# Patient Record
Sex: Male | Born: 1980
Health system: Southern US, Community
[De-identification: ages and names within clinical notes are randomized; demographics above are authoritative.]

## PROBLEM LIST (undated history)

## (undated) DIAGNOSIS — Z83438 Family history of other disorder of lipoprotein metabolism and other lipidemia: Secondary | ICD-10-CM

## (undated) DIAGNOSIS — G8929 Other chronic pain: Secondary | ICD-10-CM

## (undated) DIAGNOSIS — F411 Generalized anxiety disorder: Secondary | ICD-10-CM

## (undated) DIAGNOSIS — F430 Acute stress reaction: Secondary | ICD-10-CM

## (undated) DIAGNOSIS — M542 Cervicalgia: Secondary | ICD-10-CM

## (undated) DIAGNOSIS — E559 Vitamin D deficiency, unspecified: Secondary | ICD-10-CM

## (undated) DIAGNOSIS — F410 Panic disorder [episodic paroxysmal anxiety] without agoraphobia: Secondary | ICD-10-CM

## (undated) DIAGNOSIS — Z8249 Family history of ischemic heart disease and other diseases of the circulatory system: Secondary | ICD-10-CM

## (undated) DIAGNOSIS — E663 Overweight: Secondary | ICD-10-CM

## (undated) HISTORY — DX: Cervicalgia: M54.2

## (undated) HISTORY — DX: Acute stress reaction: F43.0

## (undated) HISTORY — DX: Other chronic pain: G89.29

## (undated) HISTORY — DX: Family history of other disorder of lipoprotein metabolism and other lipidemia: Z83.438

## (undated) HISTORY — DX: Family history of ischemic heart disease and other diseases of the circulatory system: Z82.49

## (undated) HISTORY — DX: Vitamin D deficiency, unspecified: E55.9

## (undated) HISTORY — PX: TONSILLECTOMY: SUR1361

## (undated) HISTORY — DX: Generalized anxiety disorder: F41.1

## (undated) HISTORY — DX: Panic disorder (episodic paroxysmal anxiety): F41.0

## (undated) HISTORY — DX: Overweight: E66.3

---

## 2013-09-05 DIAGNOSIS — G8929 Other chronic pain: Secondary | ICD-10-CM | POA: Insufficient documentation

## 2013-09-05 HISTORY — DX: Other chronic pain: G89.29

## 2016-05-29 ENCOUNTER — Encounter: Payer: Self-pay | Admitting: Family Medicine

## 2016-05-29 ENCOUNTER — Ambulatory Visit (INDEPENDENT_AMBULATORY_CARE_PROVIDER_SITE_OTHER): Payer: Self-pay | Admitting: Family Medicine

## 2016-05-29 VITALS — BP 110/72 | HR 75 | Ht 74.0 in | Wt 215.0 lb

## 2016-05-29 DIAGNOSIS — E663 Overweight: Secondary | ICD-10-CM

## 2016-05-29 DIAGNOSIS — F430 Acute stress reaction: Secondary | ICD-10-CM

## 2016-05-29 DIAGNOSIS — F411 Generalized anxiety disorder: Secondary | ICD-10-CM

## 2016-05-29 DIAGNOSIS — F410 Panic disorder [episodic paroxysmal anxiety] without agoraphobia: Secondary | ICD-10-CM

## 2016-05-29 DIAGNOSIS — E559 Vitamin D deficiency, unspecified: Secondary | ICD-10-CM

## 2016-05-29 DIAGNOSIS — M542 Cervicalgia: Secondary | ICD-10-CM

## 2016-05-29 DIAGNOSIS — G8929 Other chronic pain: Secondary | ICD-10-CM

## 2016-05-29 MED ORDER — VITAMIN D (ERGOCALCIFEROL) 1.25 MG (50000 UNIT) PO CAPS: 50000.0000 [IU] | ORAL_CAPSULE | ORAL | Status: DC

## 2016-05-29 MED ORDER — FLUOXETINE HCL 20 MG PO TABS: ORAL_TABLET | ORAL | Status: DC

## 2016-05-29 MED ORDER — ALPRAZOLAM 0.5 MG PO TABS: ORAL_TABLET | ORAL | Status: DC

## 2016-05-29 MED ORDER — ALPRAZOLAM 0.5 MG PO TBDP: ORAL_TABLET | ORAL | Status: DC

## 2016-05-29 NOTE — Progress Notes (Signed)
Goes by Samuel Vasquez   Samuel Vasquez, D.O. [facility]  Subjective:    CC: New pt, here to establish care.   HPI: Samuel Vasquez is a pleasant 35 y.o. male who [facility] today to become est and tx his anxiety.   Patient was seen by [facility] in the [facility]    He was taking lexapro in past but went off when lost to f/up - been off it for about 1-1.3461mo now.   Worked well for pt but he gained 20 lbs on it.  Tried other SSRI's- paxil- pt didn't like it made him feel like a zombie.  Patient's Mom hated wellbutrin and pt would like to try something else.   4-5 yrs ago- Dr Samuel Vasquez, used clonazapam  I had pt on neurontin in the past for his chronic neck pain - but didn't tol it, so LYRIC given- tol well and worked well but too expensive  Trying to eat better;  joined [employer] couple mo ago and goes 2-3 d/wk- runs treadmill,  lifts weights.   Vitamin D deficiency: Patient stopped his supplements and doesn't know what to take.   PMHX: Vit D defeciency  anxiety Chronic neck pain and arthriitis    Past Medical History  Diagnosis Date  . Chronic neck pain     Past Surgical History  Procedure Laterality Date  . Tonsillectomy      Family History  Problem Relation Age of Onset  . Hyperlipidemia Mother   . Hypertension Mother   . Hypertension Brother     History  Drug Use No  ,  History  Alcohol Use  . Yes    Comment: socially  ,  History  Smoking status  . Former Smoker  . Quit date: 12/21/2010  Smokeless tobacco  . Never Used  ,  History  Sexual Activity  . Sexual Activity: Yes  . Birth Control/ Protection: None    Patient's Medications   No medications on file    ALLERGIES: Review of patient's allergies indicates no known allergies.   Review of Systems: Full 14 point ROS performed via "adult medical history form".  Negative except for noted above    Objective:   Blood  pressure 110/72, pulse 75, height 6\' 2"  (1.88 m), weight 215 lb (97.523 kg). Body mass index is 27.59 kg/(m^2).  General: Well Developed, well nourished, and in no acute distress.  Neuro: Alert and oriented x3, extra-ocular muscles intact, sensation grossly intact.  HEENT: Normocephalic, atraumatic, pupils equal round reactive to light, neck supple, no gross masses, no carotid bruits, no JVD apprec Skin: no gross suspicious lesions or rashes  Cardiac: Regular rate and rhythm, no murmurs rubs or gallops.  Respiratory: Essentially clear to auscultation bilaterally. Not using accessory muscles, speaking in full sentences.  Abdominal: Soft, not grossly distended Musculoskeletal: Ambulates w/o diff, FROM * 4 ext.  Vasc: less 2 sec cap RF, warm and pink  Psych:  No HI/SI, judgement and insight good.    Impression and Recommendations:    The patient was counselled, risk factors were discussed, anticipatory guidance given.  Overweight (BMI 25.0-29.9) Diet and exercise counseling; recommend weight loss of 5-10%.  Vitamin D insufficiency Patient prefers to take 1 pill a week rather than a daily over-the-counter supplement. Prescription for ergocalciferol given. Patient understands he needs to take this daily since he has an indoor job 6 days  per week.  Panic attack as reaction to stress Risk benefits of Xanax discussed with patient.   Abuse potential and need for elevation of dose reviewed with patient. Only use as needed for panic attack   GAD (generalized anxiety disorder) Start low dose and will increase slowly.  Chronic cervical pain Told patient we will get his anxiolytics and medication regimen under control prior to adding on Neurontin which may cause sedation and possible side effects as well. We will address this in the near future at follow-up visits. Stretching, and physical activity will improve chronic condition.    Gross side effects, risk and benefits, and alternatives of  medications discussed with patient.  Patient is aware that all medications have potential side effects and we are unable to predict every sideeffect or drug-drug interaction that may occur.  Expresses verbal understanding and consents to current therapy plan and treatment regiment.  Note: This document was prepared using Dragon voice recognition software and may include unintentional dictation errors.

## 2016-05-29 NOTE — Patient Instructions (Addendum)
- Start fluoxetine- titrate up slowly - risks of xanax discussed with pt - f/up OV in 3-4 wks- come fasting so we can get yrly bloodwork   Adjustment Disorder  Adjustment disorder is an unusually severe reaction to a stressful life event, such as the loss of a job or physical illness. The event may be any stressful event other than the loss of a loved one. Adjustment disorder may affect your feelings, your thinking, how you act, or a combination of these. It may interfere with personal relationships or with the way you are at work, school, or home. People with this disorder are at risk for suicide and substance abuse. They may develop a more serious mental disorder, such as major depressive disorder or post-traumatic stress disorder. SIGNS AND SYMPTOMS  Symptoms may include:  Sadness, depressed mood, or crying spells.  Loss of enjoyment.  Change in appetite or weight.  Sense of loss or hopelessness.  Thoughts of suicide.  Anxiety, worry, or nervousness.  Trouble sleeping.  Avoiding family and friends.  Poor school performance.  Fighting or vandalism.  Reckless driving.  Skipping school.  Poor work International aid/development worker.  Ignoring bills. Symptoms of adjustment disorder start within 3 months of the stressful life event. They do not last more than 6 months after the event has ended. DIAGNOSIS  To make a diagnosis, your health care provider will ask about what has happened in your life and how it has affected you. He or she may also ask about your medical history and use of medicines, alcohol, and other substances. Your health care provider may do a physical exam and order lab tests or other studies. You may be referred to a mental health specialist for evaluation. TREATMENT  Treatment options include:  Counseling or talk therapy. Talk therapy is usually provided by mental health specialists.  Medicine. Certain medicines may help with depression, anxiety, and sleep.  Support groups.  Support groups offer emotional support, advice, and guidance. They are made up of people who have had similar experiences. HOME CARE INSTRUCTIONS  Keep all follow-up visits as directed by your health care provider. This is important.  Take medicines only as directed by your health care provider. SEEK MEDICAL CARE IF:  Your symptoms get worse.  SEEK IMMEDIATE MEDICAL CARE IF: You have serious thoughts about hurting yourself or someone else. MAKE SURE YOU:  Understand these instructions.  Will watch your condition.  Will get help right away if you are not doing well or get worse.   This information is not intended to replace advice given to you by your health care provider. Make sure you discuss any questions you have with your health care provider.   Document Released: 08/11/2006 Document Revised: 12/28/2014 Document Reviewed: 05/01/2014 Elsevier Interactive Patient Education 2016 Elsevier Inc.   Generalized Anxiety Disorder Generalized anxiety disorder (GAD) is a mental disorder. It interferes with life functions, including relationships, work, and school. GAD is different from normal anxiety, which everyone experiences at some point in their lives in response to specific life events and activities. Normal anxiety actually helps [place] prepare for and get through these life events and activities. Normal anxiety goes away after the event or activity is over.  GAD causes anxiety that is not necessarily related to specific events or activities. It also causes excess anxiety in proportion to specific events or activities. The anxiety associated with GAD is also difficult to control. GAD can vary from mild to severe. People with severe GAD can have intense  waves of anxiety with physical symptoms (panic attacks).  SYMPTOMS The anxiety and worry associated with GAD are difficult to control. This anxiety and worry are related to many life events and activities and also occur more days than not for  6 months or longer. People with GAD also have three or more of the following symptoms (one or more in children):  Restlessness.   Fatigue.  Difficulty concentrating.   Irritability.  Muscle tension.  Difficulty sleeping or unsatisfying sleep. DIAGNOSIS GAD is diagnosed through an assessment by your health care provider. Your health care provider will ask you questions aboutyour mood,physical symptoms, and events in your life. Your health care provider may ask you about your medical history and use of alcohol or drugs, including prescription medicines. Your health care provider may also do a physical exam and blood tests. Certain medical conditions and the use of certain substances can cause symptoms similar to those associated with GAD. Your health care provider may refer you to a mental health specialist for further evaluation. TREATMENT The following therapies are usually used to treat GAD:   Medication. Antidepressant medication usually is prescribed for long-term daily control. Antianxiety medicines may be added in severe cases, especially when panic attacks occur.   Talk therapy (psychotherapy). Certain types of talk therapy can be helpful in treating GAD by providing support, education, and guidance. A form of talk therapy called cognitive behavioral therapy can teach you healthy ways to think about and react to daily life events and activities.  Stress managementtechniques. These include yoga, meditation, and exercise and can be very helpful when they are practiced regularly. A mental health specialist can help determine which treatment is best for you. Some people see improvement with one therapy. However, other people require a combination of therapies.   This information is not intended to replace advice given to you by your health care provider. Make sure you discuss any questions you have with your health care provider.   Document Released: 04/03/2013 Document Revised:  12/28/2014 Document Reviewed: 04/03/2013 Elsevier Interactive Patient Education [employer]2016 Elsevier Inc.

## 2016-06-01 DIAGNOSIS — F410 Panic disorder [episodic paroxysmal anxiety] without agoraphobia: Secondary | ICD-10-CM | POA: Insufficient documentation

## 2016-06-01 DIAGNOSIS — E663 Overweight: Secondary | ICD-10-CM

## 2016-06-01 DIAGNOSIS — E559 Vitamin D deficiency, unspecified: Secondary | ICD-10-CM

## 2016-06-01 DIAGNOSIS — F430 Acute stress reaction: Secondary | ICD-10-CM

## 2016-06-01 DIAGNOSIS — F411 Generalized anxiety disorder: Secondary | ICD-10-CM | POA: Insufficient documentation

## 2016-06-01 HISTORY — DX: Vitamin D deficiency, unspecified: E55.9

## 2016-06-01 HISTORY — DX: Generalized anxiety disorder: F41.1

## 2016-06-01 HISTORY — DX: Panic disorder (episodic paroxysmal anxiety): F43.0

## 2016-06-01 HISTORY — DX: Overweight: E66.3

## 2016-06-01 HISTORY — DX: Panic disorder (episodic paroxysmal anxiety): F41.0

## 2016-06-01 NOTE — Assessment & Plan Note (Signed)
Risk benefits of Xanax discussed with patient.   Abuse potential and need for elevation of dose reviewed with patient. Only use as needed for panic attack

## 2016-06-01 NOTE — Assessment & Plan Note (Signed)
Diet and exercise counseling; recommend weight loss of 5-10%.

## 2016-06-01 NOTE — Assessment & Plan Note (Signed)
Start low dose and will increase slowly.

## 2016-06-01 NOTE — Assessment & Plan Note (Signed)
Patient prefers to take 1 pill a week rather than a daily over-the-counter supplement. Prescription for ergocalciferol given. Patient understands he needs to take this daily since he has an indoor job 6 days per week.

## 2016-06-01 NOTE — Assessment & Plan Note (Signed)
Told patient we will get his anxiolytics and medication regimen under control prior to adding on Neurontin which may cause sedation and possible side effects as well. We will address this in the near future at follow-up visits. Stretching, and physical activity will improve chronic condition.

## 2016-06-26 ENCOUNTER — Ambulatory Visit (INDEPENDENT_AMBULATORY_CARE_PROVIDER_SITE_OTHER): Payer: BLUE CROSS/BLUE SHIELD | Admitting: Family Medicine

## 2016-06-26 ENCOUNTER — Encounter: Payer: Self-pay | Admitting: Family Medicine

## 2016-06-26 VITALS — BP 124/80 | HR 67 | Ht 74.0 in | Wt 211.3 lb

## 2016-06-26 DIAGNOSIS — E663 Overweight: Secondary | ICD-10-CM

## 2016-06-26 DIAGNOSIS — Z113 Encounter for screening for infections with a predominantly sexual mode of transmission: Secondary | ICD-10-CM | POA: Diagnosis not present

## 2016-06-26 DIAGNOSIS — Z832 Family history of diseases of the blood and blood-forming organs and certain disorders involving the immune mechanism: Secondary | ICD-10-CM

## 2016-06-26 DIAGNOSIS — F410 Panic disorder [episodic paroxysmal anxiety] without agoraphobia: Secondary | ICD-10-CM

## 2016-06-26 DIAGNOSIS — E559 Vitamin D deficiency, unspecified: Secondary | ICD-10-CM

## 2016-06-26 DIAGNOSIS — F430 Acute stress reaction: Secondary | ICD-10-CM

## 2016-06-26 DIAGNOSIS — Z8249 Family history of ischemic heart disease and other diseases of the circulatory system: Secondary | ICD-10-CM

## 2016-06-26 DIAGNOSIS — Z0000 Encounter for general adult medical examination without abnormal findings: Secondary | ICD-10-CM

## 2016-06-26 DIAGNOSIS — Z708 Other sex counseling: Secondary | ICD-10-CM

## 2016-06-26 DIAGNOSIS — Z114 Encounter for screening for human immunodeficiency virus [HIV]: Secondary | ICD-10-CM | POA: Diagnosis not present

## 2016-06-26 DIAGNOSIS — Z83438 Family history of other disorder of lipoprotein metabolism and other lipidemia: Secondary | ICD-10-CM

## 2016-06-26 DIAGNOSIS — F411 Generalized anxiety disorder: Secondary | ICD-10-CM | POA: Diagnosis not present

## 2016-06-26 HISTORY — DX: Family history of other disorder of lipoprotein metabolism and other lipidemia: Z83.438

## 2016-06-26 HISTORY — DX: Family history of ischemic heart disease and other diseases of the circulatory system: Z82.49

## 2016-06-26 NOTE — Progress Notes (Signed)
Subjective:    Chief Complaint  Patient presents with  . Anxiety    follow up    HPI: Samuel Vasquez Samuel Vasquez("Samuel Vasquez ")  is a 35 y.o. male who [facility] today for follow-up of his anxiety.   We started at 10 mg of Prozac last office visit. At that time his GAD-7 was a score of 10 and repeat of this today was a score of 3.  Patient states that his anxiety is significantly improved and he is over 60% improved today.  He has only had to use a couple tablets of the Xanax within the past 2 weeks.    He also states he feels more calm and his mind constantly going and worrying about things has almost altogether stopped.  He has not increased his visits to the gym and still only going 2-3 times per week.  Patient is sexually active with a new girlfriend for the past couple months.   He states he does not always use condoms. Has not had an STD screen in 2+ years.      Patient is fasting today and is here to obtain yearly lab work as well as would like STD screening after our discussion.     Past Medical History  Diagnosis Date  . Chronic neck pain   . GAD (generalized anxiety disorder) 06/01/2016  . Panic attack as reaction to stress 06/01/2016  . Chronic cervical pain 09/05/2013  . Vitamin D insufficiency 06/01/2016  . Overweight (BMI 25.0-29.9) 06/01/2016     Past Surgical History  Procedure Laterality Date  . Tonsillectomy       Family History  Problem Relation Age of Onset  . Hyperlipidemia Mother   . Hypertension Mother   . Hypertension Brother      History  Drug Use No  ,  History  Alcohol Use  . Yes    Comment: socially  ,  History  Smoking status  . Former Smoker  . Quit date: 12/21/2010  Smokeless tobacco  . Never Used  ,  History  Sexual Activity  . Sexual Activity: Yes  . Birth Control/ Protection: None      Current Outpatient Prescriptions on File Prior to Visit  Medication Sig Dispense Refill  . ALPRAZolam  (XANAX) 0.5 MG tablet take one tab po prn panic attack 30 tablet 0  . FLUoxetine (PROZAC) 20 MG tablet Take 1/2 tab for one week, then 1 tab a day 90 tablet 1  . Vitamin D, Ergocalciferol, (DRISDOL) 50000 units CAPS capsule Take 1 capsule (50,000 Units total) by mouth every 7 (seven) days. Take for 8 total doses(weeks) 12 capsule 6   No current facility-administered medications on file prior to visit.    No Known Allergies    Review of Systems:  ( Completed via adult medical history intake form today ) General:  Denies fever, chills, appetite changes, unexplained weight loss.  Respiratory: Denies SOB, DOE, cough, wheezing.  Cardiovascular: Denies chest pain, palpitations.  Gastrointestinal: Denies nausea, vomiting, diarrhea, abdominal pain.  Genitourinary: Denies dysuria, increased frequency, flank pain. Endocrine: Denies hot or cold intolerance, polyuria, polydipsia. Musculoskeletal: Denies myalgias, back pain, joint swelling, arthralgias, gait problems.  Skin: Denies pallor, rash, suspicious lesions.  Neurological: Denies dizziness, seizures, syncope, unexplained weakness, lightheadedness, numbness and headaches.  Psychiatric/Behavioral: Denies mood changes, suicidal or homicidal ideations, hallucinations, sleep disturbances.    Objective:    Blood pressure 124/80, pulse 67, height  6\' 2"  (1.88 m), weight 211 lb 4.8 oz (95.845 kg). Body mass index is 27.12 kg/(m^2). General: Well Developed, well nourished, and in no acute distress.  HEENT: Normocephalic, atraumatic, pupils equal round reactive to light, neck supple Skin: Warm and dry, cap RF less 2 sec Cardiac: Regular rate and rhythm, S1, S2 WNL's, no murmurs rubs or gallops Respiratory: ECTA B/L, Not using accessory muscles, speaking in full sentences. NeuroM-Sk: Ambulates w/o assistance, moves ext * 4 w/o difficulty, sensation grossly intact.  Psych: A and O *3, judgement and insight good.     Impression and  Recommendations:    The patient was counselled, risk factors were discussed, anticipatory guidance given.   1) GAD/panic attacks:  continue Prozac at 20 mg per day.  Counseling done regarding Xanax and told to use sparingly. Reviewed square breathing techniques and stress management techniques with patient and asked him to do 10 square breathings 3 times per day.  Encouraged increase in moderate intensity aerobic activity 150 minutes per week. 2)  unprotected sex:  safe sex practices discussed with patient and STD counseling done. Recommend both he and his partner get a full battery of tests 3) fasting labs done today. ---- Return to clinic 6 months for complete physical exam or sooner if problems\issues or concerns. Since patient is well known to me from my prior practices, we will just give him a call regarding the results of his recent screening tests.  Gross side effects, risk and benefits, and alternatives of medications discussed with patient.  Patient is aware that all medications have potential side effects and we are unable to predict every side effect or drug-drug interaction that may occur.  Expresses verbal understanding and consents to current therapy plan and treatment regimen.  Note: This document was prepared using Dragon voice recognition software and may include unintentional dictation errors.

## 2016-06-27 LAB — CBC WITH DIFFERENTIAL/PLATELET
BASOS ABS: 66 {cells}/uL (ref 0–200)
Basophils Relative: 2 %
EOS ABS: 33 {cells}/uL (ref 15–500)
Eosinophils Relative: 1 %
HEMATOCRIT: 46.4 % (ref 38.5–50.0)
Hemoglobin: 16.2 g/dL (ref 13.2–17.1)
LYMPHS PCT: 31 %
Lymphs Abs: 1023 cells/uL (ref 850–3900)
MCH: 31.8 pg (ref 27.0–33.0)
MCHC: 34.9 g/dL (ref 32.0–36.0)
MCV: 91 fL (ref 80.0–100.0)
MONO ABS: 330 {cells}/uL (ref 200–950)
MONOS PCT: 10 %
MPV: 11.3 fL (ref 7.5–12.5)
NEUTROS ABS: 1848 {cells}/uL (ref 1500–7800)
Neutrophils Relative %: 56 %
PLATELETS: 259 10*3/uL (ref 140–400)
RBC: 5.1 MIL/uL (ref 4.20–5.80)
RDW: 12.9 % (ref 11.0–15.0)
WBC: 3.3 10*3/uL — ABNORMAL LOW (ref 3.8–10.8)

## 2016-06-27 LAB — COMPREHENSIVE METABOLIC PANEL
ALBUMIN: 4.5 g/dL (ref 3.6–5.1)
ALK PHOS: 44 U/L (ref 40–115)
ALT: 15 U/L (ref 9–46)
AST: 19 U/L (ref 10–40)
BILIRUBIN TOTAL: 0.8 mg/dL (ref 0.2–1.2)
BUN: 16 mg/dL (ref 7–25)
CALCIUM: 9.3 mg/dL (ref 8.6–10.3)
CHLORIDE: 100 mmol/L (ref 98–110)
CO2: 26 mmol/L (ref 20–31)
CREATININE: 0.94 mg/dL (ref 0.60–1.35)
Glucose, Bld: 85 mg/dL (ref 65–99)
POTASSIUM: 4.3 mmol/L (ref 3.5–5.3)
Sodium: 139 mmol/L (ref 135–146)
TOTAL PROTEIN: 7.2 g/dL (ref 6.1–8.1)

## 2016-06-27 LAB — LIPID PANEL
CHOL/HDL RATIO: 2.5 ratio (ref <=5.0)
Cholesterol: 181 mg/dL (ref 125–200)
HDL: 73 mg/dL (ref >=40)
LDL Cholesterol: 99 mg/dL (ref <130)
Triglycerides: 45 mg/dL (ref <150)
VLDL: 9 mg/dL (ref <30)

## 2016-06-27 LAB — HIV ANTIBODY (ROUTINE TESTING W REFLEX): HIV: NONREACTIVE

## 2016-06-27 LAB — HEMOGLOBIN A1C
Hgb A1c MFr Bld: 5 % (ref <5.7)
Mean Plasma Glucose: 97 mg/dL

## 2016-06-27 LAB — HEPATITIS B SURFACE ANTIGEN: Hepatitis B Surface Ag: NEGATIVE

## 2016-06-27 LAB — RPR

## 2016-06-27 LAB — VITAMIN D 25 HYDROXY (VIT D DEFICIENCY, FRACTURES): VIT D 25 HYDROXY: 51 ng/mL (ref 30–100)

## 2016-06-27 LAB — TSH: TSH: 1.24 mIU/L (ref 0.40–4.50)

## 2016-06-27 LAB — HEPATITIS C ANTIBODY: HCV AB: NEGATIVE

## 2016-06-29 LAB — HSV(HERPES SMPLX)ABS-I+II(IGG+IGM)-BLD
HSV 1 GLYCOPROTEIN G AB, IGG: 46.9 {index} — AB (ref <0.90)
HSV 2 Glycoprotein G Ab, IgG: <0.9 Index (ref <0.90)
Herpes Simplex Vrs I&II-IgM Ab (EIA): 0.45 INDEX

## 2016-07-01 ENCOUNTER — Telehealth: Payer: Self-pay

## 2016-07-01 NOTE — Telephone Encounter (Signed)
Samuel Vasquez please let him know that not all his labs have resulted and I was waiting until they are all completed before I commented.  Yesterday I was still waiting on 3 or 4 of them today at 6:10 PM I'm still waiting on 1. Please call him and tell him this and hopefully we will get the results soon.

## 2016-07-01 NOTE — Telephone Encounter (Signed)
Pt called requesting lab results.  Please advise.  T. Solimar Maiden, CMA 

## 2016-08-31 ENCOUNTER — Other Ambulatory Visit: Payer: Self-pay

## 2016-08-31 NOTE — Progress Notes (Signed)
No refill on the Xanax- too early as I d/c pt at the OV.   Okay to refill Prozac. Patient needs to follow-up with me for this though to see how he is doing on the medicine.

## 2016-09-01 NOTE — Progress Notes (Signed)
Patient advised. He will call and schedule a follow up appointment.

## 2016-09-29 DIAGNOSIS — B009 Herpesviral infection, unspecified: Secondary | ICD-10-CM | POA: Insufficient documentation

## 2016-10-23 ENCOUNTER — Ambulatory Visit: Payer: BLUE CROSS/BLUE SHIELD | Admitting: Family Medicine

## 2016-11-05 ENCOUNTER — Ambulatory Visit: Payer: BLUE CROSS/BLUE SHIELD | Admitting: Family Medicine

## 2016-12-25 ENCOUNTER — Encounter: Payer: Self-pay | Admitting: Family Medicine

## 2016-12-25 ENCOUNTER — Ambulatory Visit (INDEPENDENT_AMBULATORY_CARE_PROVIDER_SITE_OTHER): Payer: Self-pay | Admitting: Family Medicine

## 2016-12-25 VITALS — BP 115/75 | HR 76 | Ht 74.0 in | Wt 215.6 lb

## 2016-12-25 DIAGNOSIS — F430 Acute stress reaction: Secondary | ICD-10-CM

## 2016-12-25 DIAGNOSIS — F411 Generalized anxiety disorder: Secondary | ICD-10-CM

## 2016-12-25 DIAGNOSIS — L649 Androgenic alopecia, unspecified: Secondary | ICD-10-CM

## 2016-12-25 DIAGNOSIS — F410 Panic disorder [episodic paroxysmal anxiety] without agoraphobia: Secondary | ICD-10-CM

## 2016-12-25 DIAGNOSIS — G479 Sleep disorder, unspecified: Secondary | ICD-10-CM

## 2016-12-25 DIAGNOSIS — E559 Vitamin D deficiency, unspecified: Secondary | ICD-10-CM

## 2016-12-25 MED ORDER — ALPRAZOLAM 0.5 MG PO TABS: ORAL_TABLET | ORAL | 0 refills | Status: DC

## 2016-12-25 MED ORDER — FLUOXETINE HCL 20 MG PO CAPS: 40.0000 mg | ORAL_CAPSULE | Freq: Every day | ORAL | 1 refills | Status: DC

## 2016-12-25 MED ORDER — TRAZODONE HCL 50 MG PO TABS: 50.0000 mg | ORAL_TABLET | Freq: Every evening | ORAL | 1 refills | Status: DC | PRN

## 2016-12-25 MED ORDER — VITAMIN D (ERGOCALCIFEROL) 1.25 MG (50000 UNIT) PO CAPS: 50000.0000 [IU] | ORAL_CAPSULE | ORAL | 6 refills | Status: DC

## 2016-12-25 NOTE — Progress Notes (Signed)
Impression and Recommendations:    1. Alopecia, male pattern   2. GAD (generalized anxiety disorder)   3. Sleep difficulties   4. Vitamin D insufficiency   5. Panic attack as reaction to stress     Vitamin D insufficiency Cont supp--> his vitamin D level was 10 in the past  when we started treating him.   Alopecia, male pattern Declines finesteride and other orals  We discussed his options and he desires dermatology referral at this time. ( Explained they do scalp injections and various other treatment options beyond just what I would be able to offer him.)   Sleep difficulties Sleep hygiene discussed  Handouts given  Trial of trazodone after risks and benefits discussed.  Close f/up  GAD (generalized anxiety disorder) Cont prozac.  Denies need for inc in dose.  Doing better  Panic attack as reaction to stress Risk benefits of Xanax discussed with patient.   Abuse potential and need for elevation of dose reviewed with patient.  --> Only use as needed for panic attack     Orders Placed This Encounter  Procedures  . Ambulatory referral to Dermatology    New Prescriptions   TRAZODONE (DESYREL) 50 MG TABLET    Take 1 tablet (50 mg total) by mouth at bedtime as needed for sleep.    Modified Medications   Modified Medication Previous Medication   ALPRAZOLAM (XANAX) 0.5 MG TABLET ALPRAZolam (XANAX) 0.5 MG tablet      take one tab po prn panic attack    take one tab po prn panic attack   FLUOXETINE (PROZAC) 20 MG CAPSULE FLUoxetine (PROZAC) 20 MG capsule      Take 2 capsules (40 mg total) by mouth daily.    Take 40 mg by mouth daily.   VITAMIN D, ERGOCALCIFEROL, (DRISDOL) 50000 UNITS CAPS CAPSULE Vitamin D, Ergocalciferol, (DRISDOL) 50000 units CAPS capsule      Take 1 capsule (50,000 Units total) by mouth every 7 (seven) days. Take for 8 total doses(weeks)    Take 1 capsule (50,000 Units total) by mouth every 7 (seven) days. Take for 8 total doses(weeks)     Discontinued Medications   FLUOXETINE (PROZAC) 20 MG TABLET    Take 1/2 tab for one week, then 1 tab a day    The patient was counseled, risk factors were discussed, anticipatory guidance given.  Gross side effects, risk and benefits, and alternatives of medications and treatment plan in general discussed with patient.  Patient is aware that all medications have potential side effects and we are unable to predict every side effect or drug-drug interaction that may occur.   Patient will call with any questions prior to using medication if they have concerns.  Expresses verbal understanding and consents to current therapy and treatment regimen.  No barriers to understanding were identified.  Red flag symptoms and signs discussed in detail.  Patient expressed understanding regarding what to do in case of emergency\urgent symptoms  Return in about 3 months (around 03/25/2017).  Please see AVS handed out to patient at the end of our visit for further patient instructions/ counseling done pertaining to today's office visit.    Note: This document was prepared using Dragon voice recognition software and may include unintentional dictation errors.   --------------------------------------------------------------------------------------------------------------------------------------------------------------------------------------------------------------------------------------------    Subjective:    CC:  Chief Complaint  Patient presents with  . Anxiety    HPI: Samuel Vasquez is a 36 y.o. male was significant history of  generalized anxiety disorder with excess worry, who [facility] today for issues as discussed below.   Worried about  Male pattern baldness- getting worse.  Couple yrs now.  . Patient has tried various over-the-counter medications. This is becoming very bothersome to him.     Mood- anxiety a lot better on the prozac.    Sleeping-  problems falling asleep thinking too much. Tried nothing for sleep other than 1/2 xanax for sleep in past. Poor hygeine    Wt Readings from Last 3 Encounters:  12/25/16 215 lb 9.6 oz (97.8 kg)  06/26/16 211 lb 4.8 oz (95.8 kg)  05/29/16 215 lb (97.5 kg)   BP Readings from Last 3 Encounters:  12/25/16 115/75  06/26/16 124/80  05/29/16 110/72   Pulse Readings from Last 3 Encounters:  12/25/16 76  06/26/16 67  05/29/16 75   BMI Readings from Last 3 Encounters:  12/25/16 27.68 kg/m  06/26/16 27.13 kg/m  05/29/16 27.60 kg/m     Patient Care Team    Relationship Specialty Notifications Start End  Thomasene Loteborah Finlay Godbee, DO PCP - General Family Medicine  05/29/16     Patient Active Problem List   Diagnosis Date Noted  . Alopecia, male pattern 12/25/2016  . Sleep difficulties 12/25/2016  . Family history of hypertension 06/26/2016  . Family history of mixed hyperlipidemia 06/26/2016  . GAD (generalized anxiety disorder) 06/01/2016  . Panic attack as reaction to stress 06/01/2016  . Vitamin D insufficiency 06/01/2016  . Overweight (BMI 25.0-29.9) 06/01/2016  . Chronic cervical pain 09/05/2013    Past Medical history, Surgical history, Family history, Social history, Allergies and Medications have been entered into the medical record, reviewed and changed as needed.   Allergies:  No Known Allergies  Review of Systems  Constitutional: Negative for weight loss.  Eyes: Negative for blurred vision and double vision.  Respiratory: Negative for shortness of breath and wheezing.   Cardiovascular: Negative for chest pain and palpitations.  Gastrointestinal: Positive for heartburn. Negative for constipation, diarrhea, nausea and vomiting.       Chronic takes otc meds prn w/ good relief  Musculoskeletal:       Chronic jt pains  Skin: Negative for rash.  Neurological: Negative for dizziness and focal weakness.  Psychiatric/Behavioral: Negative for depression and suicidal ideas. The  patient is nervous/anxious and has insomnia.      Objective:   Blood pressure 115/75, pulse 76, height 6\' 2"  (1.88 m), weight 215 lb 9.6 oz (97.8 kg). Body mass index is 27.68 kg/m. General: Well Developed, well nourished, appropriate for stated age.  Neuro: Alert and oriented x3, extra-ocular muscles intact, sensation grossly intact.  HEENT: Normocephalic, atraumatic, neck supple, no carotid bruits appreciated  Skin: no gross rash. Male pattern baldness- receeding hairline, hair thinning. Cardiac: RRR, S1 S2 Respiratory: ECTA B/L, Not using accessory muscles, speaking in full sentences-unlabored. Vascular:  Ext warm, dry, pink; cap RF less 2 sec. Psych: No HI/SI, judgement and insight good, Euthymic mood. Full Affect.

## 2016-12-25 NOTE — Patient Instructions (Addendum)
Start the trazodone for sleep about 45 minutes before bedtime. Also start with only a half a tablet even though it says 1 tablet on the bottle. If a half which to sleep and use sleep well, only continue with a half a tablet. No need to go to one full tablet.     If you have insomnia or difficulty sleeping, this information is for you:  - Avoid caffeinated beverages after lunch,  no alcoholic beverages,  no eating within 2-3 hours of lying down,  avoid exposure to blue light before bed,  avoid daytime naps, and  needs to maintain a regular sleep schedule- go to sleep and wake up around the same time every night.   - Resolve concerns or worries before entering bedroom:  Discussed relaxation techniques with patient and to keep a journal to write down fears\ worries.  I suggested seeing a counselor for CBT.   - Recommend patient meditate or do deep breathing exercises to help relax.   Incorporate the use of white noise machines or listen to "sleep meditation music", or recordings of guided meditations for sleep from YouTube which are free, such as  "guided meditation for detachment from over thinking"  by Ina KickMichael Sealey.

## 2016-12-25 NOTE — Assessment & Plan Note (Addendum)
Cont supp--> his vitamin D level was 10 in the past  when we started treating him.

## 2016-12-28 NOTE — Assessment & Plan Note (Signed)
Risk benefits of Xanax discussed with patient.   Abuse potential and need for elevation of dose reviewed with patient.  --> Only use as needed for panic attack

## 2016-12-28 NOTE — Assessment & Plan Note (Addendum)
Declines finesteride and other orals  We discussed his options and he desires dermatology referral at this time. ( Explained they do scalp injections and various other treatment options beyond just what I would be able to offer him.)

## 2016-12-28 NOTE — Assessment & Plan Note (Signed)
Cont prozac.  Denies need for inc in dose.  Doing better

## 2016-12-28 NOTE — Assessment & Plan Note (Addendum)
Sleep hygiene discussed  Handouts given  Trial of trazodone after risks and benefits discussed.  Close f/up

## 2017-03-22 ENCOUNTER — Ambulatory Visit: Payer: BLUE CROSS/BLUE SHIELD | Admitting: Family Medicine

## 2017-04-21 ENCOUNTER — Ambulatory Visit: Payer: BLUE CROSS/BLUE SHIELD | Admitting: Family Medicine

## 2017-05-10 ENCOUNTER — Encounter: Payer: Self-pay | Admitting: Family Medicine

## 2017-05-10 ENCOUNTER — Ambulatory Visit (INDEPENDENT_AMBULATORY_CARE_PROVIDER_SITE_OTHER): Payer: BLUE CROSS/BLUE SHIELD | Admitting: Adult Health

## 2017-05-10 DIAGNOSIS — F411 Generalized anxiety disorder: Secondary | ICD-10-CM | POA: Diagnosis not present

## 2017-05-10 MED ORDER — ALPRAZOLAM 0.5 MG PO TABS: ORAL_TABLET | ORAL | 0 refills | Status: DC

## 2017-05-10 NOTE — Progress Notes (Signed)
Subjective:    Patient ID: Samuel Vasquez, male    DOB: 05-23-1981, 36 y.o.   MRN: [id number]  HPI:  Samuel Vasquez is her for f/u: anxiety and depression.  Compliant on all medications and denies SE. Denies thoughts of harming himself/others.   Estimates taking 4-5 tabs of alprazolam weekly r/t acute stress during work.  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   Anxiety started in middle school and worsened while in college at [employer].  He underwent a few months of CBT at [facility], however didn't feel that it helped him much. He works [phone] [employer] Lives with his sister and her boyfriend. He doe snot have a significant other or children, He denies ETOH or tobacco use. He has [employer], however does not use it. He drinks plenty of water and only two cups of coffee in am only. He has a few social outlets with local friends and co-workers. He studied nutrition at [employer], however stopped at his junior year. Diet-consists of a Vasquez of fast foods. Continue to have insomnia, however it is improving.  Patient Care Team    Relationship Specialty Notifications Start End  Samuel Lot, DO PCP - General Family Medicine  05/29/16     Patient Active Problem List   Diagnosis Date Noted  . Alopecia, male pattern 12/25/2016  . Sleep difficulties 12/25/2016  . Family history of hypertension 06/26/2016  . Family history of mixed hyperlipidemia 06/26/2016  . GAD (generalized anxiety disorder) 06/01/2016  . Panic attack as reaction to stress 06/01/2016  . Vitamin D insufficiency 06/01/2016  . Overweight (BMI 25.0-29.9) 06/01/2016  . Chronic cervical pain 09/05/2013     Past Medical History:  Diagnosis Date  . Chronic cervical pain 09/05/2013  . Chronic neck pain   . Family history of hypertension 06/26/2016  . Family history of mixed hyperlipidemia 06/26/2016  . GAD (generalized anxiety disorder) 06/01/2016  . Overweight (BMI 25.0-29.9) 06/01/2016  . Panic attack as reaction to  stress 06/01/2016  . Vitamin D insufficiency 06/01/2016     Past Surgical History:  Procedure Laterality Date  . TONSILLECTOMY       Family History  Problem Relation Age of Onset  . Hyperlipidemia Mother   . Hypertension Mother   . Hypertension Brother      History  Drug Use No     History  Alcohol Use  . Yes    Comment: socially     History  Smoking Status  . Former Smoker  . Quit date: 12/21/2010  Smokeless Tobacco  . Never Used     Outpatient Encounter Prescriptions as of 05/10/2017  Medication Sig  . ALPRAZolam (XANAX) 0.5 MG tablet take one tab po prn panic attack  . FLUoxetine (PROZAC) 20 MG capsule Take 2 capsules (40 mg total) by mouth daily.  . traZODone (DESYREL) 50 MG tablet Take 1 tablet (50 mg total) by mouth at bedtime as needed for sleep.  . Vitamin D, Ergocalciferol, (DRISDOL) 50000 units CAPS capsule Take 1 capsule (50,000 Units total) by mouth every 7 (seven) days. Take for 8 total doses(weeks)  . [DISCONTINUED] ALPRAZolam (XANAX) 0.5 MG tablet take one tab po prn panic attack   No facility-administered encounter medications on file as of 05/10/2017.     Allergies: Patient has no known allergies.  Body mass index is 27.19 kg/m.  Blood pressure 96/61, pulse 74, height 6\' 2"  (1.88 m), weight 211 lb 12.8 oz (96.1 kg).  Review of Systems  Constitutional: Positive for fatigue. Negative for activity change, appetite change, chills, diaphoresis, fever and unexpected weight change.  Eyes: Negative for visual disturbance.  Respiratory: Negative for cough, chest tightness, shortness of breath, wheezing and stridor.   Cardiovascular: Negative for chest pain, palpitations and leg swelling.  Neurological: Negative for dizziness and headaches.  Psychiatric/Behavioral: Positive for sleep disturbance. Negative for agitation, behavioral problems, confusion, decreased concentration, dysphoric mood, self-injury and suicidal ideas. The patient is  nervous/anxious. The patient is not hyperactive.        Objective:   Physical Exam  Constitutional: He appears well-developed and well-nourished. No distress.  HENT:  Head: Normocephalic.  Eyes: Conjunctivae are normal.  Cardiovascular: Normal rate, regular rhythm, normal heart sounds and intact distal pulses.   No murmur heard. Pulmonary/Chest: Effort normal and breath sounds normal. No respiratory distress. He has no wheezes. He has no rales. He exhibits no tenderness.  Skin: Skin is warm and dry. No rash noted. No erythema. No pallor.  Psychiatric: He has a normal mood and affect. His behavior is normal. Judgment and thought content normal.  Nursing note and vitals reviewed.         Assessment & Plan:   1. GAD (generalized anxiety disorder)     GAD (generalized anxiety disorder) Continue Fluoxetine 20mg  daily and PRN alprazolam (RF provided). Practice sleep hygiene. Follow heart healthy diet and increase regular exercise to at least 7730mins/5 times week. Has [employer] membership-Use it! CBT referral placed-strongly urged to augment medication with therapy. F/u 3 months, sooner if needed.    FOLLOW-UP:  Return in about 3 months (around 08/10/2017) for Regular Follow Up.

## 2017-05-10 NOTE — Patient Instructions (Addendum)
Generalized Anxiety Disorder, Adult Generalized anxiety disorder (GAD) is a mental health disorder. People with this condition constantly worry about everyday events. Unlike normal anxiety, worry related to GAD is not triggered by a specific event. These worries also do not fade or get better with time. GAD interferes with life functions, including relationships, work, and school. GAD can vary from mild to severe. People with severe GAD can have intense waves of anxiety with physical symptoms (panic attacks). What are the causes? The exact cause of GAD is not known. What increases the risk? This condition is more likely to develop in:  Women.  People who have a family history of anxiety disorders.  People who are very shy.  People who experience very stressful life events, such as the death of a loved one.  People who have a very stressful family environment. What are the signs or symptoms? People with GAD often worry excessively about many things in their lives, such as their health and family. They may also be overly concerned about:  Doing well at work.  Being on time.  Natural disasters.  Friendships. Physical symptoms of GAD include:  Fatigue.  Muscle tension or having muscle twitches.  Trembling or feeling shaky.  Being easily startled.  Feeling like your heart is pounding or racing.  Feeling out of breath or like you cannot take a deep breath.  Having trouble falling asleep or staying asleep.  Sweating.  Nausea, diarrhea, or irritable bowel syndrome (IBS).  Headaches.  Trouble concentrating or remembering facts.  Restlessness.  Irritability. How is this diagnosed? Your health care provider can diagnose GAD based on your symptoms and medical history. You will also have a physical exam. The health care provider will ask specific questions about your symptoms, including how severe they are, when they started, and if they come and go. Your health care  provider may ask you about your use of alcohol or drugs, including prescription medicines. Your health care provider may refer you to a mental health specialist for further evaluation. Your health care provider will do a thorough examination and may perform additional tests to rule out other possible causes of your symptoms. To be diagnosed with GAD, a person must have anxiety that:  Is out of his or her control.  Affects several different aspects of his or her life, such as work and relationships.  Causes distress that makes him or her unable to take part in normal activities.  Includes at least three physical symptoms of GAD, such as restlessness, fatigue, trouble concentrating, irritability, muscle tension, or sleep problems. Before your health care provider can confirm a diagnosis of GAD, these symptoms must be present more days than they are not, and they must last for six months or longer. How is this treated? The following therapies are usually used to treat GAD:  Medicine. Antidepressant medicine is usually prescribed for long-term daily control. Antianxiety medicines may be added in severe cases, especially when panic attacks occur.  Talk therapy (psychotherapy). Certain types of talk therapy can be helpful in treating GAD by providing support, education, and guidance. Options include:  Cognitive behavioral therapy (CBT). People learn coping skills and techniques to ease their anxiety. They learn to identify unrealistic or negative thoughts and behaviors and to replace them with positive ones.  Acceptance and commitment therapy (ACT). This treatment teaches people how to be mindful as a way to cope with unwanted thoughts and feelings.  Biofeedback. This process trains you to manage your body's response (  physiological response) through breathing techniques and relaxation methods. You will work with a therapist while machines are used to monitor your physical symptoms.  Stress  management techniques. These include yoga, meditation, and exercise. A mental health specialist can help determine which treatment is best for you. Some people see improvement with one type of therapy. However, other people require a combination of therapies. Follow these instructions at home:  Take over-the-counter and prescription medicines only as told by your health care provider.  Try to maintain a normal routine.  Try to anticipate stressful situations and allow extra time to manage them.  Practice any stress management or self-calming techniques as taught by your health care provider.  Do not punish yourself for setbacks or for not making progress.  Try to recognize your accomplishments, even if they are small.  Keep all follow-up visits as told by your health care provider. This is important. Contact a health care provider if:  Your symptoms do not get better.  Your symptoms get worse.  You have signs of depression, such as:  A persistently sad, cranky, or irritable mood.  Loss of enjoyment in activities that used to bring you joy.  Change in weight or eating.  Changes in sleeping habits.  Avoiding friends or family members.  Loss of energy for normal tasks.  Feelings of guilt or worthlessness. Get help right away if:  You have serious thoughts about hurting yourself or others. If you ever feel like you may hurt yourself or others, or have thoughts about taking your own life, get help right away. You can go to your nearest emergency department or call:  Your local emergency services (911 in the [place]  A suicide crisis helpline, such as the National Suicide Prevention Lifeline at 989-278-19781-518 611 6166. This is open 24 hours a day. Summary  Generalized anxiety disorder (GAD) is a mental health disorder that involves worry that is not triggered by a specific event.  People with GAD often worry excessively about many things in their lives, such as their health and  family.  GAD may cause physical symptoms such as restlessness, trouble concentrating, sleep problems, frequent sweating, nausea, diarrhea, headaches, and trembling or muscle twitching.  A mental health specialist can help determine which treatment is best for you. Some people see improvement with one type of therapy. However, other people require a combination of therapies. This information is not intended to replace advice given to you by your health care provider. Make sure you discuss any questions you have with your health care provider. Document Released: 04/03/2013 Document Revised: 10/27/2016 Document Reviewed: 10/27/2016 Elsevier Interactive Patient Education  2017 Elsevier [employer].  [place] health referral placed. Continue medications as directed. Refill on Xanax provided. Increase regular exercise-use that [employer] membership. Consider finishing college degree. Practice Sleep Hygiene. Follow-up in 3 months, sooner if needed.

## 2017-05-10 NOTE — Assessment & Plan Note (Signed)
Continue Fluoxetine 20mg  daily and PRN alprazolam (RF provided). Practice sleep hygiene. Follow heart healthy diet and increase regular exercise to at least 3030mins/5 times week. Has [employer] membership-Use it! CBT referral placed-strongly urged to augment medication with therapy. F/u 3 months, sooner if needed.

## 2017-07-27 ENCOUNTER — Other Ambulatory Visit: Payer: Self-pay

## 2017-07-27 ENCOUNTER — Telehealth: Payer: Self-pay | Admitting: Family Medicine

## 2017-07-27 DIAGNOSIS — F411 Generalized anxiety disorder: Secondary | ICD-10-CM

## 2017-07-27 MED ORDER — ALPRAZOLAM 0.5 MG PO TABS: ORAL_TABLET | ORAL | 0 refills | Status: DC

## 2017-07-27 MED ORDER — FLUOXETINE HCL 20 MG PO CAPS: 40.0000 mg | ORAL_CAPSULE | Freq: Every day | ORAL | 0 refills | Status: DC

## 2017-07-27 NOTE — Telephone Encounter (Signed)
Patient was told by his pharmacy for a refill of his xanax and prozac. Please advise

## 2017-07-28 NOTE — Telephone Encounter (Signed)
Called patient and left message to call the office.  Refill for 30 days of Prozac was sent into the pharmacy and Xanax # 10 is up front for pick.  Patient needs an office visit for follow up.  MPulliam, CMA/RT(R)

## 2017-07-29 NOTE — Telephone Encounter (Signed)
Patient called back and was notified, appointment was made for follow up.  MPulliam, CMA/RT(R)

## 2017-08-05 ENCOUNTER — Encounter: Payer: Self-pay | Admitting: Family Medicine

## 2017-08-05 ENCOUNTER — Ambulatory Visit (INDEPENDENT_AMBULATORY_CARE_PROVIDER_SITE_OTHER): Payer: BLUE CROSS/BLUE SHIELD | Admitting: Family Medicine

## 2017-08-05 VITALS — BP 116/69 | HR 76 | Ht 72.0 in | Wt 210.5 lb

## 2017-08-05 DIAGNOSIS — G479 Sleep disorder, unspecified: Secondary | ICD-10-CM

## 2017-08-05 DIAGNOSIS — F430 Acute stress reaction: Secondary | ICD-10-CM

## 2017-08-05 DIAGNOSIS — F410 Panic disorder [episodic paroxysmal anxiety] without agoraphobia: Secondary | ICD-10-CM

## 2017-08-05 DIAGNOSIS — F411 Generalized anxiety disorder: Secondary | ICD-10-CM

## 2017-08-05 MED ORDER — ALPRAZOLAM 0.5 MG PO TABS: ORAL_TABLET | ORAL | 0 refills | Status: DC

## 2017-08-05 MED ORDER — BUSPIRONE HCL 10 MG PO TABS: 10.0000 mg | ORAL_TABLET | Freq: Three times a day (TID) | ORAL | 5 refills | Status: DC

## 2017-08-05 MED ORDER — FLUOXETINE HCL 20 MG PO CAPS: 40.0000 mg | ORAL_CAPSULE | Freq: Every day | ORAL | 1 refills | Status: DC

## 2017-08-05 NOTE — Patient Instructions (Addendum)
Busparone- start taking One half tablet 3 times daily for 1 week then go to the full tablet daily 3 times a day as written on your prescription.  - f/up 8   - Please call one of the counselors below or go online and look up your insurance and see who is in your network for a counselor\behavioral therapist.  Please call make an appointment on your own.    Behavioral Health/Psychiatry Referrals    Francee NodalJo Heather Dodson, [place]  [phone] [email or web address] ( she does [place]ian and faith-based coaching and counseling)    Bethann BerkshireScott Young [email or web address] [employer]- gen counseling;  PH-D   Corine Shelteresa McKinney, MSW [address]  [place] [phone]   [facility] Caralyn Guileavid Gutterman, PhD [address], [facility] [phone]  [facility] [address], [address], [place] [phone]  Heloise BeechamAlicia Brown-Licensed Professional Counselor Counseling and [employer] Planning [phone]  [facility] MacKenzie-Substance abuse [facility] Taylor-Clinical Manager [address], [place] [phone]  [facility] [address], [place] [phone] Eliott NineMichie Dew, PhD [place] ScrapeStuart Hunt, PhD Hollace Kinnierlair Huprich, LCSW Andrena MewsJennifer Sommer, PhD-child, adolescent and adults  [facility] [address], [place] [phone] Daun PeacockSara DeHart-Young, MS-child, adolescent and adults Madelaine EtienneKatherine Glenn, PhD-adolescent and adults  [facility]grief, terminal illness [address], [place] [phone]  [facility] [address], [place] [phone] [facility] [address] [place] [phone]  [facility] [address], [place] [phone]  [facility] [address], [address],  [place] [phone]  [facility] [address], [place] [phone]  [facility] [address], [address], [place] [phone]  [facility] [address], [address], [place] [phone]

## 2017-08-05 NOTE — Progress Notes (Signed)
Impression and Recommendations:    No diagnosis found.  - start buspar  - ween xanax  - obtain counselor  - exercise QD  No problem-specific Assessment & Plan notes found for this encounter.   The patient was counseled, risk factors were discussed, anticipatory guidance given.   New Prescriptions   No medications on file     Discontinued Medications   TRAZODONE (DESYREL) 50 MG TABLET    Take 1 tablet (50 mg total) by mouth at bedtime as needed for sleep.     Modified Medications   No medications on file      No orders of the defined types were placed in this encounter.    Gross side effects, risk and benefits, and alternatives of medications and treatment plan in general discussed with patient.  Patient is aware that all medications have potential side effects and we are unable to predict every side effect or drug-drug interaction that may occur.   Patient will call with any questions prior to using medication if they have concerns.  Expresses verbal understanding and consents to current therapy and treatment regimen.  No barriers to understanding were identified.  Red flag symptoms and signs discussed in detail.  Patient expressed understanding regarding what to do in case of emergency\urgent symptoms  [facility] 08/05/2017 05/29/2016  Decreased Interest 1 0  Down, Depressed, Hopeless 1 0  PHQ - 2 Score 2 0  Altered sleeping 2 -  Tired, decreased energy 2 -  Change in appetite 1 -  Feeling bad or failure about yourself  0 -  Trouble concentrating 1 -  Moving slowly or fidgety/restless 1 -  Suicidal thoughts 0 -  PHQ-9 Score 9 -  Difficult doing work/chores Somewhat difficult -   GAD 7 : Generalized Anxiety Score 08/05/2017 05/10/2017 12/25/2016 06/26/2016  Nervous, Anxious, on Edge 2 1 1 1   Control/stop worrying 1 1 0 0  Worry too much - different things 2 2 1  0  Trouble relaxing 2 1 2 1   Restless 2 1 1 1   Easily annoyed or irritable 1 1 0  0  Afraid - awful might happen 1 1 0 0  Total GAD 7 Score 11 8 5 3   Anxiety Difficulty Somewhat difficult Somewhat difficult Somewhat difficult Somewhat difficult    Please see AVS handed out to patient at the end of our visit for further patient instructions/ counseling done pertaining to today's office visit.   No Follow-up on file.     Note: This document was prepared using Dragon voice recognition software and may include unintentional dictation errors.  Averyana Pillars 1:21 PM --------------------------------------------------------------------------------------------------------------------------------------------------------------------------------------------------------------------------------------------    Subjective:    CC:  Chief Complaint  Patient presents with  . Anxiety    follow up     HPI: Samuel Vasquez is a 36 y.o. male who [facility] today for issues as discussed below.  GAD- doing well.   Xanax QOD-  during panic attacks.   Taking prozac 40mg - doing well.  No s-e.    Not using trazodone much- not sleeping well or badly, occ uses it once wkly.  No S-E or handover effect.  Makes him feel a little weird the next day  No problems updated.   Wt Readings from Last 3 Encounters:  08/05/17 210 lb 8 oz (95.5 kg)  05/10/17 211 lb 12.8 oz (96.1 kg)  12/25/16 215 lb 9.6 oz (97.8 kg)  BP Readings from Last 3 Encounters:  08/05/17 116/69  05/10/17 96/61  12/25/16 115/75   Pulse Readings from Last 3 Encounters:  08/05/17 76  05/10/17 74  12/25/16 76   BMI Readings from Last 3 Encounters:  08/05/17 28.55 kg/m  05/10/17 27.19 kg/m  12/25/16 27.68 kg/m     Patient Care Team    Relationship Specialty Notifications Start End  Thomasene Lot, DO PCP - General Family Medicine  05/29/16      Patient Active Problem List   Diagnosis Date Noted  . Alopecia, male pattern 12/25/2016  . Sleep difficulties  12/25/2016  . Family history of hypertension 06/26/2016  . Family history of mixed hyperlipidemia 06/26/2016  . GAD (generalized anxiety disorder) 06/01/2016  . Panic attack as reaction to stress 06/01/2016  . Vitamin D insufficiency 06/01/2016  . Overweight (BMI 25.0-29.9) 06/01/2016  . Chronic cervical pain 09/05/2013    Past Medical history, Surgical history, Family history, Social history, Allergies and Medications have been entered into the medical record, reviewed and changed as needed.    Current Meds  Medication Sig  . ALPRAZolam (XANAX) 0.5 MG tablet take one tab po prn panic attack  . FLUoxetine (PROZAC) 20 MG capsule Take 2 capsules (40 mg total) by mouth daily.  . Vitamin D, Ergocalciferol, (DRISDOL) 50000 units CAPS capsule Take 1 capsule (50,000 Units total) by mouth every 7 (seven) days. Take for 8 total doses(weeks)  . [DISCONTINUED] traZODone (DESYREL) 50 MG tablet Take 1 tablet (50 mg total) by mouth at bedtime as needed for sleep.    Allergies:  No Known Allergies   Review of Systems: General:   Denies fever, chills, unexplained weight loss.  Optho/Auditory:   Denies visual changes, blurred vision/LOV Respiratory:   Denies wheeze, DOE more than baseline levels.  Cardiovascular:   Denies chest pain, palpitations, new onset peripheral edema  Gastrointestinal:   Denies nausea, vomiting, diarrhea, abd pain.  Genitourinary: Denies dysuria, freq/ urgency, flank pain or discharge from genitals.  Endocrine:     Denies hot or cold intolerance, polyuria, polydipsia. Musculoskeletal:   Denies unexplained myalgias, joint swelling, unexplained arthralgias, gait problems.  Skin:  Denies new onset rash, suspicious lesions Neurological:     Denies dizziness, unexplained weakness, numbness  Psychiatric/Behavioral:   Denies mood changes, suicidal or homicidal ideations, hallucinations    Objective:   Blood pressure 116/69, pulse 76, height 6' (1.829 m), weight 210 lb 8 oz  (95.5 kg). Body mass index is 28.55 kg/m. General:  Well Developed, well nourished, appropriate for stated age.  Neuro:  Alert and oriented,  extra-ocular muscles intact  HEENT:  Normocephalic, atraumatic, neck supple, no carotid bruits appreciated  Skin:  no gross rash, warm, pink. Cardiac:  RRR, S1 S2 Respiratory:  ECTA B/L and A/P, Not using accessory muscles, speaking in full sentences- unlabored. Vascular:  Ext warm, no cyanosis apprec.; cap RF less 2 sec. Psych:  No HI/SI, judgement and insight good, Euthymic mood. Full Affect.

## 2017-09-13 ENCOUNTER — Ambulatory Visit (INDEPENDENT_AMBULATORY_CARE_PROVIDER_SITE_OTHER): Payer: BLUE CROSS/BLUE SHIELD | Admitting: Family Medicine

## 2017-09-13 ENCOUNTER — Encounter: Payer: Self-pay | Admitting: Family Medicine

## 2017-09-13 VITALS — BP 127/84 | HR 72 | Ht 72.0 in | Wt 215.2 lb

## 2017-09-13 DIAGNOSIS — F430 Acute stress reaction: Secondary | ICD-10-CM

## 2017-09-13 DIAGNOSIS — F411 Generalized anxiety disorder: Secondary | ICD-10-CM

## 2017-09-13 DIAGNOSIS — F410 Panic disorder [episodic paroxysmal anxiety] without agoraphobia: Secondary | ICD-10-CM

## 2017-09-13 MED ORDER — BUSPIRONE HCL 15 MG PO TABS: 15.0000 mg | ORAL_TABLET | Freq: Three times a day (TID) | ORAL | 1 refills | Status: DC

## 2017-09-13 NOTE — Progress Notes (Signed)
Pt here for an acute care OV today   Impression and Recommendations:    1. GAD (generalized anxiety disorder)   2. Panic attack as reaction to stress     GAD (generalized anxiety disorder)  Panic attack as reaction to stress - Plan: busPIRone (BUSPAR) 15 MG tablet  No problem-specific Assessment & Plan notes found for this encounter.   The patient was counseled, risk factors were discussed, anticipatory guidance given.  New Prescriptions   No medications on file    Discontinued Medications   No medications on file    Modified Medications   Modified Medication Previous Medication   BUSPIRONE (BUSPAR) 15 MG TABLET busPIRone (BUSPAR) 10 MG tablet      Take 1 tablet (15 mg total) by mouth 3 (three) times daily.    Take 1 tablet (10 mg total) by mouth 3 (three) times daily.     Meds ordered this encounter  Medications  . busPIRone (BUSPAR) 15 MG tablet    Sig: Take 1 tablet (15 mg total) by mouth 3 (three) times daily.    Dispense:  270 tablet    Refill:  1    No orders of the defined types were placed in this encounter.    Gross side effects, risk and benefits, and alternatives of medications and treatment plan in general discussed with patient.  Patient is aware that all medications have potential side effects and we are unable to predict every side effect or drug-drug interaction that may occur.   Patient will call with any questions prior to using medication if they have concerns.  Expresses verbal understanding and consents to current therapy and treatment regimen.  No barriers to understanding were identified.  Red flag symptoms and signs discussed in detail.  Patient expressed understanding regarding what to do in case of emergency\urgent symptoms  Please see AVS handed out to patient at the end of our visit for further patient instructions/ counseling done pertaining to today's office visit.   Return if symptoms worsen or fail to improve after 1-2 wks-  contact me, for otherwise, I will see you in 59mo for chronic dx f/up and FBW- come in AM.     Note: This document was prepared using Dragon voice recognition software and may include unintentional dictation errors.  [name] 4:00 PM --------------------------------------------------------------------------------------------------------------------------------------------------------------------------------------------------------------------------------------------    Subjective:    CC:  Chief Complaint  Patient presents with  . Anxiety    HPI: Samuel Vasquez is a 36 y.o. male who presents to [facility] today for issues as discussed below.  Brother- doing heroine and went into hospital for infection in arm. Pt did not know he was back on it.    Mom - sick as well- battling cellulitis - on O2 tank now. Large woman and unhealthy.  Only 56 or so.   Been much more anxious lately.  Can't sleep and tired as heck.   Taking all meds as prescibed- had panic attack yesterday- having about 2 / wk.  he did not recall exactly how to increase dose of buspirone for this occasion as well.    Having some inc stooling and loose stools since he has been more upset lately. Marland Kitchen   No problems updated.   Wt Readings from Last 3 Encounters:  09/13/17 215 lb 3.2 oz (97.6 kg)  08/05/17 210 lb 8 oz (95.5 kg)  05/10/17 211 lb 12.8 oz (96.1 kg)   BP Readings from Last 3  Encounters:  09/13/17 127/84  08/05/17 116/69  05/10/17 96/61   BMI Readings from Last 3 Encounters:  09/13/17 29.19 kg/m  08/05/17 28.55 kg/m  05/10/17 27.19 kg/m     Patient Care Team    Relationship Specialty Notifications Start End  Thomasene Lot, DO PCP - General Family Medicine  05/29/16      Patient Active Problem List   Diagnosis Date Noted  . Alopecia, male pattern 12/25/2016  . Sleep difficulties 12/25/2016  . Family history of hypertension 06/26/2016  . Family history  of mixed hyperlipidemia 06/26/2016  . GAD (generalized anxiety disorder) 06/01/2016  . Panic attack as reaction to stress 06/01/2016  . Vitamin D insufficiency 06/01/2016  . Overweight (BMI 25.0-29.9) 06/01/2016  . Chronic cervical pain 09/05/2013    Past Medical history, Surgical history, Family history, Social history, Allergies and Medications have been entered into the medical record, reviewed and changed as needed.    Current Meds  Medication Sig  . ALPRAZolam (XANAX) 0.5 MG tablet take one tab po prn panic attack  . busPIRone (BUSPAR) 15 MG tablet Take 1 tablet (15 mg total) by mouth 3 (three) times daily.  Marland Kitchen FLUoxetine (PROZAC) 20 MG capsule Take 2 capsules (40 mg total) by mouth daily.  . Vitamin D, Ergocalciferol, (DRISDOL) 50000 units CAPS capsule Take 1 capsule (50,000 Units total) by mouth every 7 (seven) days. Take for 8 total doses(weeks)  . [DISCONTINUED] busPIRone (BUSPAR) 10 MG tablet Take 1 tablet (10 mg total) by mouth 3 (three) times daily.    Allergies:  No Known Allergies   Review of Systems: General:   Denies fever, chills, unexplained weight loss.  Optho/Auditory:   Denies visual changes, blurred vision/LOV Respiratory:   Denies wheeze, DOE more than baseline levels.  Cardiovascular:   Denies chest pain, palpitations, new onset peripheral edema  Gastrointestinal:   Denies nausea, vomiting, diarrhea, abd pain.  Genitourinary: Denies dysuria, freq/ urgency, flank pain or discharge from genitals.  Endocrine:     Denies hot or cold intolerance, polyuria, polydipsia. Musculoskeletal:   Denies unexplained myalgias, joint swelling, unexplained arthralgias, gait problems.  Skin:  Denies new onset rash, suspicious lesions Neurological:     Denies dizziness, unexplained weakness, numbness  Psychiatric/Behavioral:   Denies mood changes, suicidal or homicidal ideations, hallucinations    Objective:   Blood pressure 127/84, pulse 72, height 6' (1.829 m), weight 215  lb 3.2 oz (97.6 kg). Body mass index is 29.19 kg/m. General:  Well Developed, well nourished, appropriate for stated age.  Neuro:  Alert and oriented,  extra-ocular muscles intact  HEENT:  Normocephalic, atraumatic, neck supple Skin:  no gross rash, warm, pink. Cardiac:  RRR, S1 S2 Respiratory:  ECTA B/L and A/P, Not using accessory muscles, speaking in full sentences- unlabored. Vascular:  Ext warm, no cyanosis apprec.; cap RF less 2 sec. Psych:  No HI/SI, judgement and insight good, Euthymic mood. Full Affect.

## 2017-09-13 NOTE — Patient Instructions (Signed)
4 your increased stress we are going to increase your buspirone from 10 mg 3 times a day to 15 mg 3 times a day.  Continue the Prozac at 40 mg daily.  Remember you can take an extra buspirone as needed.  See below.  If you are having a more stressful time, such as a panic attack or feel extremely anxious at a certain part of the day, you can take an extra buspirone.  This would make it 15 mg in AM, 15 mg in the afternoon and 15 mg in the evening with an extra tablet when ever needed.    If you have insomnia or difficulty sleeping, this information is for you:  - Avoid caffeinated beverages after lunch,  no alcoholic beverages,  no eating within 2-3 hours of lying down,  avoid exposure to blue light before bed,  avoid daytime naps, and  needs to maintain a regular sleep schedule- go to sleep and wake up around the same time every night.   - Resolve concerns or worries before entering bedroom:  Discussed relaxation techniques with patient and to keep a journal to write down fears\ worries.  I suggested seeing a counselor for CBT.   - Recommend patient meditate or do deep breathing exercises to help relax.   Incorporate the use of white noise machines or listen to "sleep meditation music", or recordings of guided meditations for sleep from YouTube which are free, such as  "guided meditation for detachment from over thinking"  by Ina Kick.          What is Chronic Stress Syndrome, Symptoms & Ways to Deal With it   What is Chronic Stress Syndrome?  Chronic Stress Syndrome is something which can now be called as a medical condition due to the amount of stress an individual is going through these days. Chronic Stress Syndrome causes the body and mind to shutdown and the person has no control over himself or herself. Due to the demands of modern day life and the hardship throughout day and night takes its toll over a period of time and the body and brain starts demanding rest and a break. This  leads to certain symptoms where your performance level starts to dip at work, you become irritable both at work and at home, you may stop enjoying activities you previously liked, you may become depressed, you may get angry for even small things. Chronic Stress Syndrome can significantly impact your quality life. Thus it is important understand the symptoms of Chronic Stress Syndrome and react accordingly in order to cope up with it.  It is important to note here that a balanced work-home equation should be drawn to cut down symptoms of Chronic Stress Syndrome. Minor stressors can be overcome by the body's inbuilt stress response but when there is unending stress for a long period of time then an external help is required to ease the stress.  Chronic Stress Syndrome can physically and psychologically drain you over a period of time. For such cases stress management is the best way to cope up with Chronic Stress Syndrome. If Chronic Stress Syndrome is not treated then it may result in many health hazards like anxiety, muscle pain, insomnia, and high blood pressure along with a compromised immune system leading to frequent infections and missed days from work.    What are the Symptoms of Chronic Stress Syndrome?   The symptoms of Chronic Stress Syndrome are variable and range from generalized symptoms to emotional symptoms  along with behavioral and cognitive symptoms. Some of these symptoms have been delineated below:  Generalized Symptoms of Chronic Stress Syndrome are: Anxiety Depression Social isolation Headache Abdominal pain Lack of sleep Back pain Difficulty in concentrating Hypertension Hemorrhoids Diarrhea/ constipation Varicose veins Panic attacks/ Panic disorder Cardiovascular diseases.   Some of the Emotional Symptoms of Chronic Stress Syndrome are: To become easily agitated, moody and frustrated Feeling overwhelmed which makes you feel like you are losing control. Having  difficulty relaxing and have a peaceful mind Having low self esteem Feeling lonely Feeling worthless Feeling depressed Avoiding social environment.   Some of the Physical Symptoms of Chronic Stress Syndrome are: Headaches Lethargy Alternating diarrhea and constipation Nausea Muscles aches and pains Insomnia Rapid heartbeat and chest pain Infections and frequent colds Decreased libido Nervousness and shaking Tinnitus Sweaty palms Dry mouth Clenched jaw.  Some of the Cognitive Symptoms of Chronic Stress Syndrome are: Constant worrying Racing thoughts Disorganization and forgetfulness Inability to focus Poor judgment Abundance of negativity.  Some of the Behavioral Symptoms of Chronic Stress Syndrome are: Changes in appetite with less desire to eat Avoiding responsibilities Indulgence in alcohol or recreational drug use Increased nail biting and being fidgety Ways to Deal With Chronic Stress Syndrome    Chronic Stress Syndrome is not something which cannot be addressed. A bit of effort from your side in the form of lifestyle modifications, a little bit of exercise, a balanced work life equation can do wonders and help you get rid of Chronic Stress Syndrome.  Get Proper Sleep: It has been proved that Chronic Stress Syndrome causes loss of sleep where an individual may not even be able to sleep for days unending. This may result in the individual feeling lethargic and unable to focus at work the following morning. This may lead to decreased performance at work. Thus, it is important to have a good sleep-wake cycle. For this, try and not drink any caffeinated beverage about four hours prior to going to sleep, as caffeine pumps up the adrenaline and causes you to stay awake resulting ultimately in Chronic Stress Syndrome.  Avoid Alcohol and Drugs: Another way to get rid of Chronic Stress Syndrome is lifestyle modifications. Stay away from alcohol and other recreational  drugs. Take Short Frequent Breaks at Work: Try to take frequent breaks from work and do not work continuously. Try and manage your work in such a way that you even meet your deadline and come home on time for a happy dinner with family. A good time spent with family and kids does wonders in not only dealing with Chronic Stress Syndrome but also preventing it.  Become Physically Active: Another step towards getting rid of Chronic Stress Syndrome is physical activity. If you do not have time to spend at the gym then at least try and go for daily walks for about half an hour a day which not only keeps the stress away but also is good for your overall health. Physical activity leads to production of endorphins which will make you feel relaxed and feel good.  Healthy Diet Can Help You Deal With Chronic Stress Syndrome: Have a balanced and healthy diet is another step towards a stress free life and keeping Chronic Stress Syndrome at bay. If time is a constraint then you can try eating three small meals a day. Try and avoid fast foods and take foods which are healthy and rich in proteins, fiber, and carbohydrates to boost your energy system.  Music Can Soothe  Your Mind: Light music is one of the best and most effective relaxation techniques that one can try to overcome stress. It has shown to calm down the mind and take you away from all the stressors that you may be having. These days it is also being used as a therapy in some institutes for overcoming stress. It is important here to discuss the importance of a good social support system for patients with Chronic Stress Syndrome, as a good social support framework can do wonders in taking the stress away from the patient and overcoming Chronic Stress Syndrome.  Meditation Can Help You Deal With Chronic Stress Syndrome Effectively: Meditation and yoga has also shown to be quite effective in relaxing the mind and coping up with Chronic Stress Syndrome   In cases  where these measures are not helpful, then it is time for you to consult with a skilled psychologist or a psychiatrist for potential therapies or medications to control the stress response.   The psychologist can help you with a variety of steps for coping up with Chronic Stress Syndrome. Relaxation techniques and behavioral therapy are some of the methods employed by psychologists. In some cases, medications can also be given to help relax the patient.  Since Chronic Stress Syndrome is both emotionally and physically draining for the patient and it also adversely affects the family life of the patient hence it is important for the patient to recognize the condition and taking steps to cope up with it. Escaping measures like alcohol and drug use are of no help as they only aggravate the condition apart from their other health hazards. If this condition is ignored or left untreated it can lead to various medical conditions like anxiety and depression and various other medical conditions.  Last but not least, smile as often as you can as it is the best gift that you can give to someone. The best way to stay relaxed is to have a good smile, exercise daily, spend time with your family, meditation and if required consultation with a good psychologist so that you can live a stress free life and overcome the symptoms of Chronic Stress Syndrome.

## 2017-09-30 ENCOUNTER — Ambulatory Visit: Payer: BLUE CROSS/BLUE SHIELD | Admitting: Family Medicine

## 2018-01-03 ENCOUNTER — Ambulatory Visit: Payer: BLUE CROSS/BLUE SHIELD | Admitting: Family Medicine

## 2018-01-14 ENCOUNTER — Ambulatory Visit: Payer: BLUE CROSS/BLUE SHIELD | Admitting: Family Medicine

## 2019-08-15 ENCOUNTER — Other Ambulatory Visit: Payer: Self-pay

## 2019-08-15 ENCOUNTER — Encounter: Payer: Self-pay | Admitting: Family Medicine

## 2019-08-15 ENCOUNTER — Ambulatory Visit (INDEPENDENT_AMBULATORY_CARE_PROVIDER_SITE_OTHER): Payer: 59 | Admitting: Family Medicine

## 2019-08-15 VITALS — BP 130/80 | Ht 74.0 in | Wt 195.0 lb

## 2019-08-15 DIAGNOSIS — F411 Generalized anxiety disorder: Secondary | ICD-10-CM

## 2019-08-15 DIAGNOSIS — F459 Somatoform disorder, unspecified: Secondary | ICD-10-CM | POA: Diagnosis not present

## 2019-08-15 DIAGNOSIS — G479 Sleep disorder, unspecified: Secondary | ICD-10-CM | POA: Diagnosis not present

## 2019-08-15 DIAGNOSIS — F410 Panic disorder [episodic paroxysmal anxiety] without agoraphobia: Secondary | ICD-10-CM

## 2019-08-15 DIAGNOSIS — F430 Acute stress reaction: Secondary | ICD-10-CM

## 2019-08-15 DIAGNOSIS — E559 Vitamin D deficiency, unspecified: Secondary | ICD-10-CM

## 2019-08-15 DIAGNOSIS — F4521 Hypochondriasis: Secondary | ICD-10-CM

## 2019-08-15 MED ORDER — FLUOXETINE HCL 20 MG PO TABS: ORAL_TABLET | ORAL | 0 refills | Status: DC

## 2019-08-15 MED ORDER — BUSPIRONE HCL 10 MG PO TABS: ORAL_TABLET | ORAL | 0 refills | Status: AC

## 2019-08-15 MED ORDER — VITAMIN D (ERGOCALCIFEROL) 1.25 MG (50000 UNIT) PO CAPS: 50000.0000 [IU] | ORAL_CAPSULE | ORAL | 6 refills | Status: DC

## 2019-08-15 NOTE — Progress Notes (Signed)
Virtual / live video office visit note for Samuel & McLennanDeborah Fahmida Vasquez, D.O- Primary Care Physician at [facility]   I connected with current patient today and beyond visually recognizing the correct individual, I verified that I am speaking with the correct person using two identifiers.  . Location of the patient: Home . Location of the provider: Office Only the patient (+/- their family members at pt's discretion) and myself were participating in the encounter    - This visit type was conducted due to national recommendations for restrictions regarding the COVID-19 Pandemic (e.g. social distancing) in an effort to limit this patient's exposure and mitigate transmission in our community.  This format is felt to be most appropriate for this patient at this time.   - The patient did have access to video technology today    - No physical exam could be performed with this format, beyond that communicated to us by the patient/ family members as noted.   - Additionally my office staff/ schedulers discussed with the patient that there may be a monetary charge related to this service, depending on patient's medical insurance.   The patient expressed understanding, and agreed to proceed.      History of Present Illness:   Hasn't been back for follow-up since 9 of 2018 because of lack of insurance.  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  [place] living in [place] as [place].  GAD, Panic Attack as Reaction to Stress Has been out of meds for a long time and states "it's been crazy."  Feels he's been more depressed, "might be due to this corona, I don't know what it is."  Confirms he's struggling right now.  "I started smoking cigarettes because my nerves got so bad.  I need to quit, for real."  To cope, he's mostly been smoking cigarettes.  Says he tries to play video games or something to help, but "sometimes it's just too much, you  know?"  Feeling anxious, paranoid; "same old stuff as like I had."  Confirms he has also been having physical symptoms of anxiety.  Denies thoughts of harming himself or others.  Says "I feel anxious or down, but I ain't that down."  Lifestyle & Support System at Home He lives with his sister and her boyfriend.  Regarding his support system at home, states "I guess."  Isn't sure if he's interested in going to a counselor because unsure if he could afford one.  Sleep Difficulties Can't sleep well; "it might be because of this job, because I work from 6 in the afternoon to 6 in the morning for three days per week," Sunday night, Monday night, and Tuesday night.  Says he gets 3-4 hours of sleep per night and tries to adjust to his schedule for the rest of the week.  Has a hard time functioning on these low levels of sleep, "it's hard."    Depression screen [facility] 08/15/2019 09/13/2017 08/05/2017  Decreased Interest 0 1 1  Down, Depressed, Hopeless 1 1 1   PHQ - 2 Score 1 2 2   Altered sleeping 3 2 2   Tired, decreased energy 2 2 2   Change in appetite 3 1 1   Feeling bad or failure about yourself  0 1 0  Trouble concentrating 1 2 1   Moving slowly or fidgety/restless 1 2 1   Suicidal thoughts 0 0 0  PHQ-9 Score 11 12 9  Difficult doing work/chores Somewhat difficult Somewhat difficult Somewhat difficult    GAD 7 : Generalized Anxiety Score 08/15/2019 09/13/2017 08/05/2017 05/10/2017  Nervous, Anxious, on Edge 1 3 2 1   Control/stop worrying 3 3 1 1   Worry too much - different things 2 3 2 2   Trouble relaxing 3 3 2 1   Restless 1 3 2 1   Easily annoyed or irritable 0 1 1 1   Afraid - awful might happen 0 0 1 1  Total GAD 7 Score 10 16 11 8   Anxiety Difficulty Somewhat difficult Very difficult Somewhat difficult Somewhat difficult     Impression and Recommendations:    1. Panic attack as reaction to stress   2. GAD (generalized anxiety disorder)   3. Sleep difficulties   4. Psychosomatic  disorder   5. Hypochondriasis   6. Vitamin D insufficiency     - Last labs obtained in 2017.  - Has been out of fluoxetine and Buspar (off mood medicines) since February/March 2019. - Per patient, lost insurance coverage and has recently regained it.   - GOALS for NEXT VISIT: 1. Try to go to bed consistently around the same time every night. 2. Try to exercise 15 minutes a couple of days per week.   GAD, panic attack as a reaction to stress - Discussed need to resume medications slowly. - Refills of medications provided today.  See med list.  - Patient formerly managed on 15 of Buspar. - Begin lower dose Buspar.  Will taper up to full dose.  See med list.   - Patient formerly managed on 40 mg Prozac. - Begin lower dose Prozac.  Will taper up to full dose.  See med list.  - In addition to prescription intervention, reviewed the "spokes of the wheel" of mood and health management.  Stressed the importance of ongoing prudent habits, including regular exercise, appropriate sleep hygiene, healthful dietary habits, and prayer/meditation to relax.    - Patient may also look into therapy/counseling or a life coach if he feels he can afford this option.  - Health counseling performed.  All questions answered. - Will continue to monitor.  - Return in 4 weeks or less for complete physical and blood work. - Will increase mood medicine dosages at that time if needed.  Sleep Difficulties - Educated patient regarding appropriate sleep hygiene. - Advised patient to avoid screen time during bedtime hours. - Patient knows to try to go to sleep in a dark room. - Discussed use of melatonin prudently PRN.  - Will continue to monitor. - If patient is still having a difficult time sleeping at next visit, will re-evaluate.  Lifestyle & Preventative Health Maintenance - Advised patient to continue working toward daily physical activity to improve overall mental, physical, and emotional health.     - Encouraged patient to engage in a formal exercise routine.  Recommended that the patient eventually strive for at least 150 minutes of moderate cardiovascular activity per week according to guidelines established by the [facility].   - Healthy dietary habits encouraged, including low-carb, high antioxidant, with high amounts of lean protein in diet.   - Patient should also consume adequate amounts of water.  Recommendations - Need for updated fasting lab work. - Return in one month for CPE and fasting lab work. - Otherwise, follow-up for mood management as scheduled. - Patient understands critical need to return for chronic health maintenance.   - As part of my medical decision making, I reviewed the following data  within the electronic MEDICAL RECORD NUMBER History obtained from pt /family, CMA notes reviewed and incorporated if applicable, Labs reviewed, Radiograph/ tests reviewed if applicable and OV notes from prior OV's with me, as well as other specialists she/he has seen since seeing me last, were all reviewed and used in my medical decision making process today.   - Additionally, discussion had with patient regarding txmnt plan, their biases about that plan etc were used in my medical decision making today.   - The patient agreed with the plan and demonstrated an understanding of the instructions.   No barriers to understanding were identified.   - Red flag symptoms and signs discussed in detail.  Patient expressed understanding regarding what to do in case of emergency\ urgent symptoms.  The patient was advised to call back or seek an in-person evaluation if the symptoms worsen or if the condition fails to improve as anticipated.   Return for less than 4 wks- come in for CPE/FBW & restarted mood meds.    Meds ordered this encounter  Medications  . busPIRone (BUSPAR) 10 MG tablet    Sig: Take 0.5 tablets (5 mg total) by mouth 2 (two) times daily for 14 days, THEN 1 tablet (10 mg total) 2  (two) times daily for 14 days.    Dispense:  42 tablet    Refill:  0  . Vitamin D, Ergocalciferol, (DRISDOL) 1.25 MG (50000 UT) CAPS capsule    Sig: Take 1 capsule (50,000 Units total) by mouth every 7 (seven) days.    Dispense:  12 capsule    Refill:  6  . FLUoxetine (PROZAC) 20 MG tablet    Sig: Take 0.5 tablets (10 mg total) by mouth daily for 8 days, THEN 1 tablet (20 mg total) daily for 22 days.    Dispense:  26 tablet    Refill:  0    Medications Discontinued During This Encounter  Medication Reason  . FLUoxetine (PROZAC) 20 MG capsule   . Vitamin D, Ergocalciferol, (DRISDOL) 50000 units CAPS capsule Reorder  . busPIRone (BUSPAR) 15 MG tablet Reorder      Note:  This note was prepared with assistance of Dragon voice recognition software. Occasional wrong-word or sound-a-like substitutions may have occurred due to the inherent limitations of voice recognition software.  This document serves as a record of services personally performed by Thomasene Lot, DO. It was created on her behalf by Peggye Fothergill, a trained medical scribe. The creation of this record is based on the scribe's personal observations and the provider's statements to them.   I have reviewed the above medical documentation for accuracy and completeness and I concur.  Thomasene Lot, DO 08/16/2019 7:48 PM     Patient Care Team    Relationship Specialty Notifications Start End  Thomasene Lot, DO PCP - General Family Medicine  05/29/16     -Vitals obtained; medications/ allergies reconciled;  personal medical, social, Sx etc.histories were updated by CMA, reviewed by me and are reflected in chart  Patient Active Problem List   Diagnosis Date Noted  . Hypochondriasis 08/19/2019  . Psychosomatic disorder 08/19/2019  . Alopecia, male pattern 12/25/2016  . Sleep difficulties 12/25/2016  . Family history of hypertension 06/26/2016  . Family history of mixed hyperlipidemia 06/26/2016  . GAD  (generalized anxiety disorder) 06/01/2016  . Panic attack as reaction to stress 06/01/2016  . Vitamin D insufficiency 06/01/2016  . Overweight (BMI 25.0-29.9) 06/01/2016  . Chronic cervical pain 09/05/2013  No outpatient medications have been marked as taking for the 08/15/19 encounter (Office Visit) with Mellody Dance, DO.     No Known Allergies   ROS:  See above HPI for pertinent positives and negatives   Objective:   Blood pressure 130/80, height 6\' 2"  (1.88 m), weight 195 lb (88.5 kg).  (if some vitals are omitted, this means that patient was UNABLE to obtain them even though they were asked to get them prior to OV today.  They were asked to call us at their earliest convenience with these once obtained.)  General: A & O * 3; visually in no acute distress; in usual state of health.  Skin: Visible skin appears normal and pt's usual skin color HEENT:  EOMI, head is normocephalic and atraumatic.  Sclera are anicteric. Neck has a good range of motion.  Lips are noncyanotic Chest: normal chest excursion and movement Respiratory: speaking in full sentences, no conversational dyspnea; no use of accessory muscles Psych: insight good, mood- appears full

## 2019-08-19 DIAGNOSIS — F459 Somatoform disorder, unspecified: Secondary | ICD-10-CM | POA: Insufficient documentation

## 2019-08-19 DIAGNOSIS — F4521 Hypochondriasis: Secondary | ICD-10-CM | POA: Insufficient documentation

## 2019-09-13 ENCOUNTER — Telehealth: Payer: Self-pay | Admitting: Family Medicine

## 2019-09-13 NOTE — Telephone Encounter (Signed)
Please call pt back and advise him that we are unable to refill medication until he has his f/u appt with Dr. Raliegh Scarlet.  Charyl Bigger, CMA

## 2019-09-13 NOTE — Telephone Encounter (Signed)
Patient is requesting a refill of of his Prozac and Buspar. If approved please send to his [facility] in [place] on [id number].

## 2019-09-15 ENCOUNTER — Other Ambulatory Visit: Payer: Self-pay | Admitting: Family Medicine

## 2019-09-15 DIAGNOSIS — F410 Panic disorder [episodic paroxysmal anxiety] without agoraphobia: Secondary | ICD-10-CM

## 2019-09-15 DIAGNOSIS — F411 Generalized anxiety disorder: Secondary | ICD-10-CM

## 2019-09-15 DIAGNOSIS — F430 Acute stress reaction: Secondary | ICD-10-CM

## 2019-09-15 NOTE — Telephone Encounter (Signed)
Patient called states he requested refill on :   (advised him OV/ TELEHEALTH) required for refills--Pt says made appt for 10/7 w/ provider & has (1) pill left .  FLUoxetine (PROZAC) 20 MG tablet [[id number]] ENDED  Order Details Dose, Route, Frequency: As Directed  Dispense Quantity: 26 tablet Refills: 0 Fills remaining: --        Sig: Take 0.5 tablets (10 mg total) by mouth daily for 8 days, THEN 1 tablet (20 mg total) daily for 22 days.     --Patient also request refill on :  busPIRone (BUSPAR) 10 MG tablet [[id number]] ENDED  Order Details Dose, Route, Frequency: As Directed  Indications of Use: Anxiety Disorder  Dispense Quantity: 42 tablet Refills: 0 Fills remaining: --        Sig: Take 0.5 tablets (5 mg total) by mouth 2 (two) times daily for 14 days, THEN 1 tablet (10 mg total) 2 (two) times daily for 14 days.     --Forwarding message to medical assistant for provider approval & send refill order to :   [place], [place] - [address] [place] [address] [phone] (Phone) [phone] (Fax)   --glh

## 2019-09-19 MED ORDER — FLUOXETINE HCL 20 MG PO TABS: 20.0000 mg | ORAL_TABLET | Freq: Every day | ORAL | 0 refills | Status: DC

## 2019-09-19 MED ORDER — BUSPIRONE HCL 10 MG PO TABS: 10.0000 mg | ORAL_TABLET | Freq: Three times a day (TID) | ORAL | 0 refills | Status: DC

## 2019-09-19 NOTE — Telephone Encounter (Signed)
Sorry I cannot just modify the medications as they were not pending.  Please call the patient and see if he is up to 20 mg of the Prozac daily as well as 10 mg of the BuSpar 2-3 times daily. -We would have to know exact dose he is taking now in order to know what to refill it at.  Thank you  -Right yes it is okay to give him 30-day supply of meds so he does not run out before his next telehealth visit with [place].

## 2019-09-19 NOTE — Telephone Encounter (Signed)
Pt has appt 09/27/2019.  Please review and refill for appropriate quantity if appropriate.  Charyl Bigger, CMA

## 2019-09-19 NOTE — Telephone Encounter (Signed)
Spoke with pt who confirms that he is taking 20mg  of fluoxetine and 10mg  of buspar.  RXs sent to pharmacy per Dr. Raliegh Scarlet.  Charyl Bigger, CMA

## 2019-09-20 ENCOUNTER — Other Ambulatory Visit: Payer: Self-pay | Admitting: Family Medicine

## 2019-09-20 DIAGNOSIS — F430 Acute stress reaction: Secondary | ICD-10-CM

## 2019-09-20 DIAGNOSIS — F411 Generalized anxiety disorder: Secondary | ICD-10-CM

## 2019-09-20 DIAGNOSIS — F410 Panic disorder [episodic paroxysmal anxiety] without agoraphobia: Secondary | ICD-10-CM

## 2019-09-27 ENCOUNTER — Encounter: Payer: Self-pay | Admitting: Family Medicine

## 2019-09-27 ENCOUNTER — Ambulatory Visit (INDEPENDENT_AMBULATORY_CARE_PROVIDER_SITE_OTHER): Payer: 59 | Admitting: Family Medicine

## 2019-09-27 ENCOUNTER — Other Ambulatory Visit: Payer: Self-pay

## 2019-09-27 VITALS — BP 120/73 | HR 66 | Ht 74.5 in | Wt 195.0 lb

## 2019-09-27 DIAGNOSIS — F430 Acute stress reaction: Secondary | ICD-10-CM

## 2019-09-27 DIAGNOSIS — F411 Generalized anxiety disorder: Secondary | ICD-10-CM

## 2019-09-27 DIAGNOSIS — Z7251 High risk heterosexual behavior: Secondary | ICD-10-CM

## 2019-09-27 DIAGNOSIS — Z719 Counseling, unspecified: Secondary | ICD-10-CM | POA: Diagnosis not present

## 2019-09-27 DIAGNOSIS — B009 Herpesviral infection, unspecified: Secondary | ICD-10-CM

## 2019-09-27 DIAGNOSIS — G479 Sleep disorder, unspecified: Secondary | ICD-10-CM | POA: Diagnosis not present

## 2019-09-27 DIAGNOSIS — F410 Panic disorder [episodic paroxysmal anxiety] without agoraphobia: Secondary | ICD-10-CM

## 2019-09-27 DIAGNOSIS — Z8249 Family history of ischemic heart disease and other diseases of the circulatory system: Secondary | ICD-10-CM

## 2019-09-27 DIAGNOSIS — Z83438 Family history of other disorder of lipoprotein metabolism and other lipidemia: Secondary | ICD-10-CM

## 2019-09-27 DIAGNOSIS — Z0000 Encounter for general adult medical examination without abnormal findings: Secondary | ICD-10-CM

## 2019-09-27 DIAGNOSIS — E559 Vitamin D deficiency, unspecified: Secondary | ICD-10-CM

## 2019-09-27 DIAGNOSIS — F459 Somatoform disorder, unspecified: Secondary | ICD-10-CM

## 2019-09-27 DIAGNOSIS — Z709 Sex counseling, unspecified: Secondary | ICD-10-CM

## 2019-09-27 NOTE — Progress Notes (Signed)
Male physical  Impression and Recommendations:    1. Encounter for wellness examination   2. Health education/counseling   3. GAD (generalized anxiety disorder)   4. Sleep difficulties   5. Sexual counseling   6. High risk sexual behavior, unspecified type   7. HSV-1 infection   8. Panic attack as reaction to stress   9. Vitamin D insufficiency   10. Psychosomatic disorder   11. Family history of mixed hyperlipidemia   12. Family history of hypertension      1) Anticipatory Guidance: Discussed importance of wearing a seatbelt while driving, not texting while driving;   sunscreen when outside along with skin surveillance; eating a balanced and modest diet; physical activity at least 25 minutes per day or 150 min/ week moderate to intense activity.  - Sexual health counseling provided today.  Before and after every new sexual partner or encounter, advised STD screen.  Discussed safe sex, using a condom every time, and testing after all new exposures.  - Prudent skin screening habits discussed and reviewed with patient during appointment today.  A,B,C,D's of skin surveillance discussed with patient today.  - Prudent self-testicular screening habits discussed and reviewed with patient during appointment today.  2) Immunizations / Screenings / Labs:   All immunizations are up-to-date per recommendations or will be updated today. Patient is due for dental and vision screens which pt will schedule independently. Will obtain CBC, CMP, HgA1c, Lipid panel, TSH and vit D when fasting, if not already done recently.   - Confirms fasting today; lab work drawn.  - Discussed STD/chlamydia screen with patient today.  See orders.  - Need for flu vaccine.  Patient declines.  - Advised establishing with eye doctor ASAP and obtaining yearly examinations.  3) Weight:   BMI meaning discussed with patient.  Discussed goal of improving nutrient density of diet through increasing intake of  fruits and vegetables and decreasing saturated fats, white flour products and refined sugars.   American Heart Association guidelines for healthy diet, basically Mediterranean diet, and exercise guidelines of 30 minutes 5 days per week or more discussed in detail.  Health counseling performed.  All questions answered.  4) [place] patient to continue working toward exercising to improve overall mental, physical, and emotional health.    - Reviewed the "spokes of the wheel" of mood and health management.  Stressed the importance of ongoing prudent habits, including regular exercise, appropriate sleep hygiene, healthful dietary habits, and prayer/meditation to relax.  - Encouraged patient to engage in daily physical activity, especially a formal exercise routine.  Recommended that the patient eventually strive for at least 150 minutes of moderate cardiovascular activity per week according to guidelines established by the [facility].   - Healthy dietary habits encouraged, including low-carb, and high amounts of lean protein in diet.   - Patient should also consume adequate amounts of water.  5) COVID-19 Counseling - Novel Covid -19 counseling done; all questions were answered.   - Current CDC / federal and [place] guidelines reviewed with patient  - Reminded pt of extreme importance of social distancing; wearing a mask when out in public; insensate handwashing and cleaning of surfaces, avoiding unnecessary trips for shopping and avoiding ALL but emergency appts etc. - Told patient to be prepared, not scared; and be smart for the sake of others - Patient will call with any additional concerns   Orders Placed This Encounter  Procedures  .  Chlamydia/GC NAA, Confirmation  . Comprehensive metabolic panel    Order Specific Question:   Has the patient fasted?    Answer:   Yes  . CBC with Differential/Platelet  . VITAMIN D 25 Hydroxy (Vit-D  Deficiency, Fractures)  . T4, free  . TSH  . Lipid panel    Order Specific Question:   Has the patient fasted?    Answer:   Yes  . Hemoglobin A1c    Return for 4 months for mood.    Gross side effects, risk and benefits, and alternatives of medications discussed with patient.  Patient is aware that all medications have potential side effects and we are unable to predict every side effect or drug-drug interaction that may occur.  Expresses verbal understanding and consents to current therapy plan and treatment regimen.  Please see AVS handed out to patient at the end of our visit for further patient instructions/ counseling done pertaining to today's office visit.  Follow-up preventative CPE in 1 year. Follow-up office visit - may be needed pending lab work.   - F/up sooner for chronic care management as above and/or prn  This document serves as a record of services personally performed by Mellody Dance, DO. It was created on her behalf by Toni Amend, a trained medical scribe. The creation of this record is based on the scribe's personal observations and the provider's statements to them.   I have reviewed the above medical documentation for accuracy and completeness and I concur.  Mellody Dance, DO 09/30/2019 3:03 PM       Subjective:    CC: CPE  HPI: Samuel Vasquez is a 38 y.o. male who presents to [place] at [facility] today for a yearly health maintenance exam.     Health Maintenance Summary Reviewed and updated, unless pt declines services.  Colonoscopy:  Denies family history of colon cancer. Tobacco History Reviewed:  Current every day smoker, 1 ppd. Alcohol:  No concerns, no excessive use. Exercise Habits:  Not meeting AHA guidelines. STD concerns:  None; no current sexual partner. Drug Use:  None reported. Birth control method:  Not currently having sex. Testicular/penile concerns:  None reported.  Working for [email or web address] right now.  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  - Lifestyle during COVID Says he wears the mask "when I have to wear it."  - Eye Health States he's never been to the eye doctor.  - Dental Health Thinks he last went to the dentist a year ago. Says they pulled his wisdom teeth.  - Dermatological Health Denies skin concerns today. Notes he doesn't really wear sunscreen, but confirms he does wear it at the beach.  Denies GI problems such as diarrhea, constipation, loose stools. Otherwise denies concerns.   Immunization History  Administered Date(s) Administered  . DTaP 10/29/1980, 12/25/1980, 02/25/1981, 09/17/1983, 07/31/1985  . IPV 10/29/1980, 12/25/1980, 02/25/1981, 09/17/1983, 07/31/1985  . MMR 12/05/1981, 12/31/2011  . Tdap 12/31/2011    Health Maintenance  Topic Date Due  . INFLUENZA VACCINE  03/20/2020 (Originally 07/22/2019)  . TETANUS/TDAP  12/30/2021  . HIV Screening  Completed      Wt Readings from Last 3 Encounters:  09/27/19 195 lb (88.5 kg)  08/15/19 195 lb (88.5 kg)  09/13/17 215 lb 3.2 oz (97.6 kg)   BP Readings from Last 3 Encounters:  09/27/19 120/73  08/15/19 130/80  09/13/17 127/84   Pulse Readings from Last 3 Encounters:  09/27/19 66  09/13/17 72  08/05/17 76  Patient Active Problem List   Diagnosis Date Noted  . Hypochondriasis 08/19/2019  . Psychosomatic disorder 08/19/2019  . Alopecia, male pattern 12/25/2016  . Sleep difficulties 12/25/2016  . HSV-1 infection 09/29/2016  . Family history of hypertension 06/26/2016  . Family history of mixed hyperlipidemia 06/26/2016  . GAD (generalized anxiety disorder) 06/01/2016  . Panic attack as reaction to stress 06/01/2016  . Vitamin D insufficiency 06/01/2016  . Overweight (BMI 25.0-29.9) 06/01/2016  . Chronic cervical pain 09/05/2013    Past Medical History:  Diagnosis Date  . Chronic cervical pain 09/05/2013  . Chronic neck pain   . Family history of hypertension 06/26/2016  . Family history of mixed  hyperlipidemia 06/26/2016  . GAD (generalized anxiety disorder) 06/01/2016  . Overweight (BMI 25.0-29.9) 06/01/2016  . Panic attack as reaction to stress 06/01/2016  . Vitamin D insufficiency 06/01/2016    Past Surgical History:  Procedure Laterality Date  . TONSILLECTOMY      Family History  Problem Relation Age of Onset  . Hyperlipidemia Mother   . Hypertension Mother   . Hypertension Brother     Social History   Substance and Sexual Activity  Drug Use No  ,  Social History   Substance and Sexual Activity  Alcohol Use Yes   Comment: socially  ,  Social History   Tobacco Use  Smoking Status Current Every Day Smoker  . Packs/day: 1.00  . Types: Cigarettes  . Last attempt to quit: 12/21/2010  . Years since quitting: 8.7  Smokeless Tobacco Never Used  ,  Social History   Substance and Sexual Activity  Sexual Activity Yes  . Birth control/protection: None    Patient's Medications  New Prescriptions   No medications on file  Previous Medications   ALPRAZOLAM (XANAX) 0.5 MG TABLET    take one tab po prn panic attack   ASPIRIN-SALICYLAMIDE-CAFFEINE (ARTHRITIS STRENGTH BC POWDER PO)    Take by mouth as needed.   BUSPIRONE (BUSPAR) 10 MG TABLET    Take 1 tablet (10 mg total) by mouth 3 (three) times daily.   FLUOXETINE (PROZAC) 20 MG TABLET    Take 1 tablet (20 mg total) by mouth daily.   VITAMIN D, ERGOCALCIFEROL, (DRISDOL) 1.25 MG (50000 UT) CAPS CAPSULE    Take 1 capsule (50,000 Units total) by mouth every 7 (seven) days.  Modified Medications   No medications on file  Discontinued Medications   No medications on file    Patient has no known allergies.  Review of Systems: General:   Denies fever, chills, unexplained weight loss.  Optho/Auditory:   Denies visual changes, blurred vision/LOV Respiratory:   Denies SOB, DOE more than baseline levels.   Cardiovascular:   Denies chest pain, palpitations, new onset peripheral edema  Gastrointestinal:   Denies nausea,  vomiting, diarrhea.  Genitourinary: Denies dysuria, freq/ urgency, flank pain or discharge from genitals.  Endocrine:     Denies hot or cold intolerance, polyuria, polydipsia. Musculoskeletal:   Denies unexplained myalgias, joint swelling, unexplained arthralgias, gait problems.  Skin:  Denies rash, suspicious lesions Neurological:     Denies dizziness, unexplained weakness, numbness  Psychiatric/Behavioral:   Denies mood changes, suicidal or homicidal ideations, hallucinations    Objective:     Blood pressure 120/73, pulse 66, height 6' 2.5" (1.892 m), weight 195 lb (88.5 kg), SpO2 99 %. Body mass index is 24.7 kg/m. General Appearance:    Alert, cooperative, no distress, appears stated age  Head:    Normocephalic, without  obvious abnormality, atraumatic  Eyes:    PERRL, conjunctiva/corneas clear, EOM's intact, fundi    benign, both eyes  Ears:    Normal TM's and external ear canals, both ears  Nose:   Nares normal, septum midline, mucosa normal, no drainage    or sinus tenderness  Throat:   Lips w/o lesion, mucosa moist, and tongue normal; teeth and   gums normal  Neck:   Supple, symmetrical, trachea midline, no adenopathy;    thyroid:  no enlargement/tenderness/nodules; no carotid   bruit or JVD  Back:     Symmetric, no curvature, ROM normal, no CVA tenderness  Lungs:     Clear to auscultation bilaterally, respirations unlabored, no       Wh/ R/ R  Chest Wall:    No tenderness or gross deformity; normal excursion   Heart:    Regular rate and rhythm, S1 and S2 normal, no murmur, rub   or gallop  Abdomen:     Soft, non-tender, bowel sounds active all four quadrants, NO   G/R/R, no masses, no organomegaly  Genitalia:    Ext genitalia: without lesion, no penile rash or discharge, no hernias appreciated   Rectal:    Deferred to age 19.  Extremities:   Extremities normal, atraumatic, no cyanosis or gross edema  Pulses:   2+ and symmetric all extremities  Skin:   Warm, dry, Skin  color, texture, turgor normal, no obvious rashes or lesions  M-Sk:   Ambulates * 4 w/o difficulty, no gross deformities, tone WNL  Neurologic:   CNII-XII intact, normal strength, sensation and reflexes    Throughout Psych:  No HI/SI, judgement and insight good, Euthymic mood. Full Affect.

## 2019-09-27 NOTE — Patient Instructions (Addendum)
Preventive Care, Male Preventive care refers to lifestyle choices and visits with your health care provider that can promote health and wellness. What does preventive care include?   A yearly physical exam. This is also called an annual well check.  Dental exams once or twice a year.  Routine eye exams. Ask your health care provider how often you should have your eyes checked.  Personal lifestyle choices, including: ? Daily care of your teeth and gums. ? Regular physical activity. ? Eating a healthy diet. ? Avoiding tobacco and drug use. ? Limiting alcohol use. ? Practicing safe sex. ? Taking low doses of aspirin every day. ? Taking vitamin and mineral supplements as recommended by your health care provider. What happens during an annual well check? The services and screenings done by your health care provider during your annual well check will depend on your age, overall health, lifestyle risk factors, and family history of disease. Counseling Your health care provider may ask you questions about your:  Alcohol use.  Tobacco use.  Drug use.  Emotional well-being.  Home and relationship well-being.  Sexual activity.  Eating habits.  History of falls.  Memory and ability to understand (cognition).  Work and work Statistician. Screening You may have the following tests or measurements:  Height, weight, and BMI.  Blood pressure.  Lipid and cholesterol levels. These may be checked every 5 years, or more frequently if you are over 23 years old.  Skin check.  Lung cancer screening. You may have this screening every year starting at age 76 if you have a 30-pack-year history of smoking and currently smoke or have quit within the past 15 years.  Colorectal cancer screening. All adults should have this screening starting at age 16 and continuing until age 26. You will have tests every 1-10 years, depending on your results and the type of screening test. People at  increased risk should start screening at an earlier age. Screening tests may include: ? Guaiac-based fecal occult blood testing. ? Fecal immunochemical test (FIT). ? Stool DNA test. ? Virtual colonoscopy. ? Sigmoidoscopy. During this test, a flexible tube with a tiny camera (sigmoidoscope) is used to examine your rectum and lower colon. The sigmoidoscope is inserted through your anus into your rectum and lower colon. ? Colonoscopy. During this test, a long, thin, flexible tube with a tiny camera (colonoscope) is used to examine your entire colon and rectum.  Prostate cancer screening. Recommendations will vary depending on your family history and other risks.  Hepatitis C blood test.  Hepatitis B blood test.  Sexually transmitted disease (STD) testing.  Diabetes screening. This is done by checking your blood sugar (glucose) after you have not eaten for a while (fasting). You may have this done every 1-3 years.  Abdominal aortic aneurysm (AAA) screening. You may need this if you are a current or former smoker.  Osteoporosis. You may be screened starting at age 66 if you are at high risk. Talk with your health care provider about your test results, treatment options, and if necessary, the need for more tests. Vaccines Your health care provider may recommend certain vaccines, such as:  Influenza vaccine. This is recommended every year.  Tetanus, diphtheria, and acellular pertussis (Tdap, Td) vaccine. You may need a Td booster every 10 years.  Varicella vaccine. You may need this if you have not been vaccinated.  Zoster vaccine. You may need this after age 60.  Measles, mumps, and rubella (MMR) vaccine. You may need  need at least one dose of MMR if you were born in 1957 or later. You may also need a second dose.  Pneumococcal 13-valent conjugate (PCV13) vaccine. One dose is recommended after age 60.  Pneumococcal polysaccharide (PPSV23) vaccine. One dose is recommended after age  61.  Meningococcal vaccine. You may need this if you have certain conditions.  Hepatitis A vaccine. You may need this if you have certain conditions or if you travel or work in places where you may be exposed to hepatitis A.  Hepatitis B vaccine. You may need this if you have certain conditions or if you travel or work in places where you may be exposed to hepatitis B.  Haemophilus influenzae type b (Hib) vaccine. You may need this if you have certain risk factors. Talk to your health care provider about which screenings and vaccines you need and how often you need them. This information is not intended to replace advice given to you by your health care provider. Make sure you discuss any questions you have with your health care provider. Document Released: 01/03/2016 Document Revised: 01/27/2018 Document Reviewed: 10/08/2015 Elsevier Interactive Patient Education  2019 [place] for Adults, Male A healthy lifestyle and preventive care can promote health and wellness. Preventive health guidelines for men include the following key practices:  A routine yearly physical is a good way to check with your health care provider about your health and preventative screening. It is a chance to share any concerns and updates on your health and to receive a thorough exam.  Visit your dentist for a routine exam and preventative care every 6 months. Brush your teeth twice a day and floss once a day. Good oral hygiene prevents tooth decay and gum disease.  The frequency of eye exams is based on your age, health, family medical history, use of contact lenses, and other factors. Follow your health care provider's recommendations for frequency of eye exams.  Eat a healthy diet. Foods such as vegetables, fruits, whole grains, low-fat dairy products, and lean protein foods contain the nutrients you need without too many calories. Decrease your intake of foods high in solid fats,  added sugars, and salt. Eat the right amount of calories for you. Get information about a proper diet from your health care provider, if necessary.  Regular physical exercise is one of the most important things you can do for your health. Most adults should get at least 150 minutes of moderate-intensity exercise (any activity that increases your heart rate and causes you to sweat) each week. In addition, most adults need muscle-strengthening exercises on 2 or more days a week.  Maintain a healthy weight. The body mass index (BMI) is a screening tool to identify possible weight problems. It provides an estimate of body fat based on height and weight. Your health care provider can find your BMI and can help you achieve or maintain a healthy weight. For adults 20 years and older:  A BMI below 18.5 is considered underweight.  A BMI of 18.5 to 24.9 is normal.  A BMI of 25 to 29.9 is considered overweight.  A BMI of 30 and above is considered obese.  Maintain normal blood lipids and cholesterol levels by exercising and minimizing your intake of saturated fat. Eat a balanced diet with plenty of fruit and vegetables. Blood tests for lipids and cholesterol should begin at age 80 and be repeated every 5 years. If your lipid or  cholesterol levels are high, you are over 50, or you are at high risk for heart disease, you may need your cholesterol levels checked more frequently. Ongoing high lipid and cholesterol levels should be treated with medicines if diet and exercise are not working.  If you smoke, find out from your health care provider how to quit. If you do not use tobacco, do not start.  Lung cancer screening is recommended for adults aged 68-80 years who are at high risk for developing lung cancer because of a history of smoking. A yearly low-dose CT scan of the lungs is recommended for people who have at least a 30-pack-year history of smoking and are a current smoker or have quit within the past 15  years. A pack year of smoking is smoking an average of 1 pack of cigarettes a day for 1 year (for example: 1 pack a day for 30 years or 2 packs a day for 15 years). Yearly screening should continue until the smoker has stopped smoking for at least 15 years. Yearly screening should be stopped for people who develop a health problem that would prevent them from having lung cancer treatment.  If you choose to drink alcohol, do not have more than 2 drinks per day. One drink is considered to be 12 ounces (355 mL) of beer, 5 ounces (148 mL) of wine, or 1.5 ounces (44 mL) of liquor.  Avoid use of street drugs. Do not share needles with anyone. Ask for help if you need support or instructions about stopping the use of drugs.  High blood pressure causes heart disease and increases the risk of stroke. Your blood pressure should be checked at least every 1-2 years. Ongoing high blood pressure should be treated with medicines, if weight loss and exercise are not effective.  If you are 80-57 years old, ask your health care provider if you should take aspirin to prevent heart disease.  Diabetes screening is done by taking a blood sample to check your blood glucose level after you have not eaten for a certain period of time (fasting). If you are not overweight and you do not have risk factors for diabetes, you should be screened once every 3 years starting at age 6. If you are overweight or obese and you are 63-48 years of age, you should be screened for diabetes every year as part of your cardiovascular risk assessment.  Colorectal cancer can be detected and often prevented. Most routine colorectal cancer screening begins at the age of 80 and continues through age 9. However, your health care provider may recommend screening at an earlier age if you have risk factors for colon cancer. On a yearly basis, your health care provider may provide home test kits to check for hidden blood in the stool. Use of a small camera  at the end of a tube to directly examine the colon (sigmoidoscopy or colonoscopy) can detect the earliest forms of colorectal cancer. Talk to your health care provider about this at age 51, when routine screening begins. Direct exam of the colon should be repeated every 5-10 years through age 23, unless early forms of precancerous polyps or small growths are found.  People who are at an increased risk for hepatitis B should be screened for this virus. You are considered at high risk for hepatitis B if:  You were born in a country where hepatitis B occurs often. Talk with your health care provider about which countries are considered high risk.  Your  parents were born in a high-risk country and you have not received a shot to protect against hepatitis B (hepatitis B vaccine).  You have HIV or AIDS.  You use needles to inject street drugs.  You live with, or have sex with, someone who has hepatitis B.  You are a man who has sex with other men (MSM).  You get hemodialysis treatment.  You take certain medicines for conditions such as cancer, organ transplantation, and autoimmune conditions.  Hepatitis C blood testing is recommended for all people born from [birth year] through 1965 and any individual with known risks for hepatitis C.  Practice safe sex. Use condoms and avoid high-risk sexual practices to reduce the spread of sexually transmitted infections (STIs). STIs include gonorrhea, chlamydia, syphilis, trichomonas, herpes, HPV, and human immunodeficiency virus (HIV). Herpes, HIV, and HPV are viral illnesses that have no cure. They can result in disability, cancer, and death.  If you are a man who has sex with other men, you should be screened at least once per year for:  HIV.  Urethral, rectal, and pharyngeal infection of gonorrhea, chlamydia, or both.  If you are at risk of being infected with HIV, it is recommended that you take a prescription medicine daily to prevent HIV infection. This  is called preexposure prophylaxis (PrEP). You are considered at risk if:  You are a man who has sex with other men (MSM) and have other risk factors.  You are a heterosexual man, are sexually active, and are at increased risk for HIV infection.  You take drugs by injection.  You are sexually active with a partner who has HIV.  Talk with your health care provider about whether you are at high risk of being infected with HIV. If you choose to begin PrEP, you should first be tested for HIV. You should then be tested every 3 months for as long as you are taking PrEP.  A one-time screening for abdominal aortic aneurysm (AAA) and surgical repair of large AAAs by ultrasound are recommended for men ages 76 to 61 years who are current or former smokers.  Healthy men should no longer receive prostate-specific antigen (PSA) blood tests as part of routine cancer screening. Talk with your health care provider about prostate cancer screening.  Testicular cancer screening is not recommended for adult males who have no symptoms. Screening includes self-exam, a health care provider exam, and other screening tests. Consult with your health care provider about any symptoms you have or any concerns you have about testicular cancer.  Use sunscreen. Apply sunscreen liberally and repeatedly throughout the day. You should seek shade when your shadow is shorter than you. Protect yourself by wearing long sleeves, pants, a wide-brimmed hat, and sunglasses year round, whenever you are outdoors.  Once a month, do a whole-body skin exam, using a mirror to look at the skin on your back. Tell your health care provider about new moles, moles that have irregular borders, moles that are larger than a pencil eraser, or moles that have changed in shape or color.  Stay current with required vaccines (immunizations).  Influenza vaccine. All adults should be immunized every year.  Tetanus, diphtheria, and acellular pertussis (Td,  Tdap) vaccine. An adult who has not previously received Tdap or who does not know his vaccine status should receive 1 dose of Tdap. This initial dose should be followed by tetanus and diphtheria toxoids (Td) booster doses every 10 years. Adults with an unknown or incomplete history of completing a  immunization series with Td-containing vaccines should begin or complete a primary immunization series including a Tdap dose. Adults should receive a Td booster every 10 years.  Varicella vaccine. An adult without evidence of immunity to varicella should receive 2 doses or a second dose if he has previously received 1 dose.  Human papillomavirus (HPV) vaccine. Males aged 11-21 years who have not received the vaccine previously should receive the 3-dose series. Males aged 22-26 years may be immunized. Immunization is recommended through the age of 10 years for any male who has sex with males and did not get any or all doses earlier. Immunization is recommended for any person with an immunocompromised condition through the age of 23 years if he did not get any or all doses earlier. During the 3-dose series, the second dose should be obtained 4-8 weeks after the first dose. The third dose should be obtained 24 weeks after the first dose and 16 weeks after the second dose.  Zoster vaccine. One dose is recommended for adults aged 76 years or older unless certain conditions are present.  Measles, mumps, and rubella (MMR) vaccine. Adults born before [birth year] generally are considered immune to measles and mumps. Adults born in [birth year] or later should have 1 or more doses of MMR vaccine unless there is a contraindication to the vaccine or there is laboratory evidence of immunity to each of the three diseases. A routine second dose of MMR vaccine should be obtained at least 28 days after the first dose for students attending postsecondary schools, health care workers, or international travelers. People who received  inactivated measles vaccine or an unknown type of measles vaccine during 1963-1967 should receive 2 doses of MMR vaccine. People who received inactivated mumps vaccine or an unknown type of mumps vaccine before 1979 and are at high risk for mumps infection should consider immunization with 2 doses of MMR vaccine. Unvaccinated health care workers born before [birth year] who lack laboratory evidence of measles, mumps, or rubella immunity or laboratory confirmation of disease should consider measles and mumps immunization with 2 doses of MMR vaccine or rubella immunization with 1 dose of MMR vaccine.  Pneumococcal 13-valent conjugate (PCV13) vaccine. When indicated, a person who is uncertain of his immunization history and has no record of immunization should receive the PCV13 vaccine. All adults 71 years of age and older should receive this vaccine. An adult aged 76 years or older who has certain medical conditions and has not been previously immunized should receive 1 dose of PCV13 vaccine. This PCV13 should be followed with a dose of pneumococcal polysaccharide (PPSV23) vaccine. Adults who are at high risk for pneumococcal disease should obtain the PPSV23 vaccine at least 8 weeks after the dose of PCV13 vaccine. Adults older than 39 years of age who have normal immune system function should obtain the PPSV23 vaccine dose at least 1 year after the dose of PCV13 vaccine.  Pneumococcal polysaccharide (PPSV23) vaccine. When PCV13 is also indicated, PCV13 should be obtained first. All adults aged 91 years and older should be immunized. An adult younger than age 98 years who has certain medical conditions should be immunized. Any person who resides in a nursing home or long-term care facility should be immunized. An adult smoker should be immunized. People with an immunocompromised condition and certain other conditions should receive both PCV13 and PPSV23 vaccines. People with human immunodeficiency virus (HIV) infection  should be immunized as soon as possible after diagnosis. Immunization during chemotherapy or radiation therapy should  be avoided. Routine use of PPSV23 vaccine is not recommended for American Indians, [place] Natives, or people younger than 65 years unless there are medical conditions that require PPSV23 vaccine. When indicated, people who have unknown immunization and have no record of immunization should receive PPSV23 vaccine. One-time revaccination 5 years after the first dose of PPSV23 is recommended for people aged 19-64 years who have chronic kidney failure, nephrotic syndrome, asplenia, or immunocompromised conditions. People who received 1-2 doses of PPSV23 before age 31 years should receive another dose of PPSV23 vaccine at age 76 years or later if at least 5 years have passed since the previous dose. Doses of PPSV23 are not needed for people immunized with PPSV23 at or after age 74 years.  Meningococcal vaccine. Adults with asplenia or persistent complement component deficiencies should receive 2 doses of quadrivalent meningococcal conjugate (MenACWY-D) vaccine. The doses should be obtained at least 2 months apart. Microbiologists working with certain meningococcal bacteria, [employer] recruits, people at risk during an outbreak, and people who travel to or live in countries with a high rate of meningitis should be immunized. A first-year college student up through age 4 years who is living in a residence hall should receive a dose if he did not receive a dose on or after his 16th birthday. Adults who have certain high-risk conditions should receive one or more doses of vaccine.  Hepatitis A vaccine. Adults who wish to be protected from this disease, have chronic liver disease, work with hepatitis A-infected animals, work in hepatitis A research labs, or travel to or work in countries with a high rate of hepatitis A should be immunized. Adults who were previously unvaccinated and who anticipate close  contact with an international adoptee during the first 60 days after arrival in the [place] from a country with a high rate of hepatitis A should be immunized.  Hepatitis B vaccine. Adults should be immunized if they wish to be protected from this disease, are under age 64 years and have diabetes, have chronic liver disease, have had more than one sex partner in the past 6 months, may be exposed to blood or other infectious body fluids, are household contacts or sex partners of hepatitis B positive people, are clients or workers in certain care facilities, or travel to or work in countries with a high rate of hepatitis B.  Haemophilus influenzae type b (Hib) vaccine. A previously unvaccinated person with asplenia or sickle cell disease or having a scheduled splenectomy should receive 1 dose of Hib vaccine. Regardless of previous immunization, a recipient of a hematopoietic stem cell transplant should receive a 3-dose series 6-12 months after his successful transplant. Hib vaccine is not recommended for adults with HIV infection. Preventive Service / Frequency Ages 105 to 35  Blood pressure check.** / Every 3-5 years.  Lipid and cholesterol check.** / Every 5 years beginning at age 93.  Hepatitis C blood test.** / For any individual with known risks for hepatitis C.  Skin self-exam. / Monthly.  Influenza vaccine. / Every year.  Tetanus, diphtheria, and acellular pertussis (Tdap, Td) vaccine.** / Consult your health care provider. 1 dose of Td every 10 years.  Varicella vaccine.** / Consult your health care provider.  HPV vaccine. / 3 doses over 6 months, if 24 or younger.  Measles, mumps, rubella (MMR) vaccine.** / You need at least 1 dose of MMR if you were born in 1957 or later. You may also need a second dose.  Pneumococcal 13-valent conjugate (  PCV13) vaccine.** / Consult your health care provider. °· Pneumococcal polysaccharide (PPSV23) vaccine.** / 1 to 2 doses if you smoke  cigarettes or if you have certain conditions. °· Meningococcal vaccine.** / 1 dose if you are age 19 to 21 years and a first-year college student living in a residence hall, or have one of several medical conditions. You may also need additional booster doses. °· Hepatitis A vaccine.** / Consult your health care provider. °· Hepatitis B vaccine.** / Consult your health care provider. °· Haemophilus influenzae type b (Hib) vaccine.** / Consult your health care provider. °Ages 40 to 64 °· Blood pressure check.** / Every year. °· Lipid and cholesterol check.** / Every 5 years beginning at age 20. °· Lung cancer screening. / Every year if you are aged 55-80 years and have a 30-pack-year history of smoking and currently smoke or have quit within the past 15 years. Yearly screening is stopped once you have quit smoking for at least 15 years or develop a health problem that would prevent you from having lung cancer treatment. °· Fecal occult blood test (FOBT) of stool. / Every year beginning at age 50 and continuing until age 75. You may not have to do this test if you get a colonoscopy every 10 years. °· Flexible sigmoidoscopy** or colonoscopy.** / Every 5 years for a flexible sigmoidoscopy or every 10 years for a colonoscopy beginning at age 50 and continuing until age 75. °· Hepatitis C blood test.** / For all people born from 1945 through 1965 and any individual with known risks for hepatitis C. °· Skin self-exam. / Monthly. °· Influenza vaccine. / Every year. °· Tetanus, diphtheria, and acellular pertussis (Tdap/Td) vaccine.** / Consult your health care provider. 1 dose of Td every 10 years. °· Varicella vaccine.** / Consult your health care provider. °· Zoster vaccine.** / 1 dose for adults aged 60 years or older. °· Measles, mumps, rubella (MMR) vaccine.** / You need at least 1 dose of MMR if you were born in 1957 or later. You may also need a second dose. °· Pneumococcal 13-valent conjugate (PCV13) vaccine.** /  Consult your health care provider. °· Pneumococcal polysaccharide (PPSV23) vaccine.** / 1 to 2 doses if you smoke cigarettes or if you have certain conditions. °· Meningococcal vaccine.** / Consult your health care provider. °· Hepatitis A vaccine.** / Consult your health care provider. °· Hepatitis B vaccine.** / Consult your health care provider. °· Haemophilus influenzae type b (Hib) vaccine.** / Consult your health care provider. °Ages 65 and over °· Blood pressure check.** / Every year. °· Lipid and cholesterol check.**/ Every 5 years beginning at age 20. °· Lung cancer screening. / Every year if you are aged 55-80 years and have a 30-pack-year history of smoking and currently smoke or have quit within the past 15 years. Yearly screening is stopped once you have quit smoking for at least 15 years or develop a health problem that would prevent you from having lung cancer treatment. °· Fecal occult blood test (FOBT) of stool. / Every year beginning at age 50 and continuing until age 75. You may not have to do this test if you get a colonoscopy every 10 years. °· Flexible sigmoidoscopy** or colonoscopy.** / Every 5 years for a flexible sigmoidoscopy or every 10 years for a colonoscopy beginning at age 50 and continuing until age 75. °· Hepatitis C blood test.** / For all people born from 1945 through 1965 and any individual with known risks for hepatitis C. °·   Abdominal aortic aneurysm (AAA) screening.** / A one-time screening for ages 37 to 22 years who are current or former smokers.  Skin self-exam. / Monthly.  Influenza vaccine. / Every year.  Tetanus, diphtheria, and acellular pertussis (Tdap/Td) vaccine.** / 1 dose of Td every 10 years.  Varicella vaccine.** / Consult your health care provider.  Zoster vaccine.** / 1 dose for adults aged 1 years or older.  Pneumococcal 13-valent conjugate (PCV13) vaccine.** / 1 dose for all adults aged 34 years and older.  Pneumococcal polysaccharide (PPSV23)  vaccine.** / 1 dose for all adults aged 34 years and older.  Meningococcal vaccine.** / Consult your health care provider.  Hepatitis A vaccine.** / Consult your health care provider.  Hepatitis B vaccine.** / Consult your health care provider.  Haemophilus influenzae type b (Hib) vaccine.** / Consult your health care provider. **Family history and personal history of risk and conditions may change your health care provider's recommendations.   This information is not intended to replace advice given to you by your health care provider. Make sure you discuss any questions you have with your health care provider.   Document Released: 02/02/2002 Document Revised: 12/28/2014 Document Reviewed: 05/04/2011 Elsevier Interactive Patient Education [employer].   Please think seriously about quitting smoking!  This is very important for your health and well being.    Please let us know in the future if you are interested and ready to quit.   You can also call 1-800-QUIT-NOW 203-678-3552) for free smoking cessation counseling and support.     Also, please go online to www.heart.org (the American Heart Association website) and search "quit smoking ".     Or try the centers for disease control website at: https://www.schmidt.com/  Or, go to the "national cancer institute" web site of NIH:  http://benson.com/   There is a lot of great information on these websites for you to look over.      Want to Quit Smoking? FDA-Approved Products Can Help  Quitting smoking can be hard, but it is possible. In fact, every time you put out a cigarette is a new chance to try quitting again, according to the U.S. Food and Drug Administrations newest tobacco education campaign, Every Try Counts.   If you want to quit--almost 70 percent of adult smokers say they do--you may want to use a smoking cessation  product proven to help. Data has shown that using FDA-approved cessation medicine can double your chance of quitting successfully.  Some products contain nicotine as an active ingredient and others do not. These products include over-the-counter (OTC) options like skin patches, lozenges, and gum, as well as prescription medicines.  Smoking cessation products are intended to help you quit smoking. They are regulated through the [facility] for Drug Evaluation and Research, which ensures that the products are safe and effective and that their benefits outweigh any known associated risks.  The Benefits of Quitting Smoking No matter how much you smoke--or for how long--quitting will benefit you.  Not only will you lower your risk of getting various cancers, including lung cancer, youll also reduce your chances of having heart disease, a stroke, emphysema, and other serious diseases. Quitting also will lower the risk of heart disease and lung cancer in nonsmokers who otherwise would be exposed to your secondhand smoke.  Although there are benefits to quitting at any age, it is important to quit as soon as possible so your body can begin to heal from the damage caused by smoking.  For instance, 12 hours after you quit smoking the carbon monoxide level in your blood drops to normal. Carbon monoxide is harmful because it displaces oxygen in the blood and deprives your heart, brain, and other vital organs of oxygen.  What To Know About Smoking Cessation Products Understanding how smoking cessation products work--and what side effects they may cause--can help you determine which product may be best for you.  If youre considering one of these products, reading labels and talking to your pharmacist and other health care providers are good first steps to take.  You also can check the FDAs website for more information on each product at Drugs_0 , where you can search for each product by name.  And remember  to weigh each products benefits and risks, among other considerations.  About Nicotine Replacement Therapy (NRT) Nicotine is the substance primarily responsible for causing addiction to tobacco products. Tobacco users who are addicted to nicotine are used to having nicotine in their bodies.  As you try to quit smoking, you may have symptoms of nicotine withdrawal. When you quit, this withdrawal may cause symptoms like cravings, or urges, to smoke; depression; trouble sleeping; irritability; anxiety; and increased appetite.  Nicotine withdrawal can discourage some smokers from continuing with a quit attempt. But the FDA has approved several smoking cessation products designed to help users gradually withdraw from smoking (that is, wean themselves from smoking) by using specific amounts of nicotine that decrease over time. This type of product is called a nicotine replacement therapy or NRT. It supplies nicotine in controlled amounts while sparing you from other chemicals found in tobacco products.  NRTs are available over the counter and by prescription. You should generally use them only for a short time to help you manage nicotine cravings and withdrawal. However, the FDA recognizes that some people may need to use these products longer to stay smoke-free. Talk to your health care provider to determine the best course of treatment for you.  Over-the-counter NRTs are approved for sale to people age 81 and older. They are available under various brand names and sometimes as generic products. They include:  - Skin patches (also called transdermal nicotine patches). These patches are placed on the skin, similar to how you would apply an adhesive bandage. - Chewing gum (also called nicotine gum). This gum must be chewed according to the labeled instructions to be effective. - Lozenges (also called nicotine lozenges). You use these products by dissolving them in your mouth. For over-the-counter  products, its important to follow the instructions on the Drug Facts Label (DFL) and to read the enclosed Users Guide for complete directions and other important information. Ask your health care provider if you have questions.  Currently, prescription nicotine replacement therapy is available only under the brand name Nicotrol, and is available both as a nasal spray and an oral inhaler. The products are FDA-approved only for use by adults.  If you are under age 21 and want to quit smoking, talk to a health care professional about whether you should use nicotine replacement therapies.  Important Advice for People Considering Nicotine Replacement Therapy Women who are pregnant or breastfeeding should talk to their health care providers and use nicotine replacement products only if the health care providers approve.  Also talk to your health care provider before using these products if you have:  diabetes, heart disease, asthma, or stomach ulcers; had a recent heart attack; high blood pressure that is not controlled with medicine; a history of  irregular heartbeat; or been prescribed medication to help you quit smoking. If you take prescription medication for depression or asthma, tell your health care provider if you are quitting smoking because he or she may need to change your prescription dose.  Stop using a nicotine replacement product and call your health care professional if you have any of the following symptoms: nausea; dizziness; weakness; vomiting; fast or irregular heartbeat; mouth problems with the lozenge or gum; or redness or swelling of the skin around the patch that does not go away.  About Prescription Cessation Medicines Without Nicotine  The FDA has approved two smoking cessation products that do not contain nicotine. They are Chantix (varenicline tartrate) and Zyban (buproprion hydrochloride). Both are available in tablet form and by prescription only.  Chantix acts  at sites in the brain affected by nicotine by reducing the rewarding effects of nicotine. The precise way that Zyban helps with smoking cessation is unknown.  As with other prescription products, the FDA has evaluated these medicines and found that the benefits outweigh the risks. For users taking these products, risks include changes in behavior, depressed mood, hostility, aggression, and suicidal thoughts or actions.  The most common side effects of Chantix include nausea; constipation; gas; vomiting; and trouble sleeping or vivid, unusual, or strange dreams. Chantix also may change how you react to alcohol, so talk to your health care provider about your drinking habits (if you drink alcohol) and whether these habits need to change. Chantix is not recommended for people under the age of 20.  The most commonly observed side effects consistently associated with the use of Zyban are dry mouth and insomnia.  Because Zyban contains the same active ingredient as the antidepressant Wellbutrin (bupropion), the FDA encourages people who use Zyban--and those who are considering it--to talk to their health care providers about the risks of treatment with antidepressant medicines. Zyban has not been studied in children under the age of 74 and is not approved for use in children and teenagers.  Note: If your health care provider prescribes Chantix or Zyban, please read the products patient medication guide in its entirety. These guides offer important information on side effects, risks, warnings, product ingredients, and what you should talk about with your health care provider before taking the products.  Finally, if you ever have any side effects related to any smoking cessation products, or have any other problems related to your treatment, the FDA would like to hear from you. Please consider making a voluntary and confidential report to the [facility] MedWatch program.  Updated: November 30, 2016

## 2019-09-28 LAB — HEMOGLOBIN A1C
Est. average glucose Bld gHb Est-mCnc: 94 mg/dL
Hgb A1c MFr Bld: 4.9 % (ref 4.8–5.6)

## 2019-09-28 LAB — LIPID PANEL
Chol/HDL Ratio: 2.4 ratio (ref 0.0–5.0)
Cholesterol, Total: 164 mg/dL (ref 100–199)
HDL: 69 mg/dL (ref >39)
LDL Chol Calc (NIH): 83 mg/dL (ref 0–99)
Triglycerides: 58 mg/dL (ref 0–149)
VLDL Cholesterol Cal: 12 mg/dL (ref 5–40)

## 2019-09-28 LAB — COMPREHENSIVE METABOLIC PANEL
ALT: 17 IU/L (ref 0–44)
AST: 22 IU/L (ref 0–40)
Albumin/Globulin Ratio: 1.8 (ref 1.2–2.2)
Albumin: 4.3 g/dL (ref 4.0–5.0)
Alkaline Phosphatase: 66 IU/L (ref 39–117)
BUN/Creatinine Ratio: 13 (ref 9–20)
BUN: 12 mg/dL (ref 6–20)
Bilirubin Total: 0.5 mg/dL (ref 0.0–1.2)
CO2: 23 mmol/L (ref 20–29)
Calcium: 9.1 mg/dL (ref 8.7–10.2)
Chloride: 102 mmol/L (ref 96–106)
Creatinine, Ser: 0.96 mg/dL (ref 0.76–1.27)
GFR calc Af Amer: 115 mL/min/{1.73_m2} (ref >59)
GFR calc non Af Amer: 99 mL/min/{1.73_m2} (ref >59)
Globulin, Total: 2.4 g/dL (ref 1.5–4.5)
Glucose: 84 mg/dL (ref 65–99)
Potassium: 4.1 mmol/L (ref 3.5–5.2)
Sodium: 139 mmol/L (ref 134–144)
Total Protein: 6.7 g/dL (ref 6.0–8.5)

## 2019-09-28 LAB — CBC WITH DIFFERENTIAL/PLATELET
Basophils Absolute: 0.1 10*3/uL (ref 0.0–0.2)
Basos: 1 %
EOS (ABSOLUTE): 0 10*3/uL (ref 0.0–0.4)
Eos: 1 %
Hematocrit: 46.7 % (ref 37.5–51.0)
Hemoglobin: 16.1 g/dL (ref 13.0–17.7)
Immature Grans (Abs): 0 10*3/uL (ref 0.0–0.1)
Immature Granulocytes: 0 %
Lymphocytes Absolute: 0.9 10*3/uL (ref 0.7–3.1)
Lymphs: 24 %
MCH: 32 pg (ref 26.6–33.0)
MCHC: 34.5 g/dL (ref 31.5–35.7)
MCV: 93 fL (ref 79–97)
Monocytes Absolute: 0.4 10*3/uL (ref 0.1–0.9)
Monocytes: 11 %
Neutrophils Absolute: 2.4 10*3/uL (ref 1.4–7.0)
Neutrophils: 63 %
Platelets: 238 10*3/uL (ref 150–450)
RBC: 5.03 x10E6/uL (ref 4.14–5.80)
RDW: 11.8 % (ref 11.6–15.4)
WBC: 3.8 10*3/uL (ref 3.4–10.8)

## 2019-09-28 LAB — VITAMIN D 25 HYDROXY (VIT D DEFICIENCY, FRACTURES): Vit D, 25-Hydroxy: 46.7 ng/mL (ref 30.0–100.0)

## 2019-09-28 LAB — TSH: TSH: 1.88 u[IU]/mL (ref 0.450–4.500)

## 2019-09-28 LAB — T4, FREE: Free T4: 1.27 ng/dL (ref 0.82–1.77)

## 2019-09-30 LAB — CHLAMYDIA/GC NAA, CONFIRMATION
Chlamydia trachomatis, NAA: NEGATIVE
Neisseria gonorrhoeae, NAA: NEGATIVE

## 2019-10-18 ENCOUNTER — Other Ambulatory Visit: Payer: Self-pay | Admitting: Family Medicine

## 2019-10-18 DIAGNOSIS — F411 Generalized anxiety disorder: Secondary | ICD-10-CM

## 2019-10-18 DIAGNOSIS — F430 Acute stress reaction: Secondary | ICD-10-CM

## 2019-10-18 DIAGNOSIS — F410 Panic disorder [episodic paroxysmal anxiety] without agoraphobia: Secondary | ICD-10-CM

## 2019-11-03 ENCOUNTER — Other Ambulatory Visit: Payer: Self-pay | Admitting: Family Medicine

## 2019-11-03 MED ORDER — BUSPIRONE HCL 10 MG PO TABS: 10.0000 mg | ORAL_TABLET | Freq: Three times a day (TID) | ORAL | 0 refills | Status: DC

## 2019-11-03 NOTE — Telephone Encounter (Signed)
Patient is aware the med has been sent to pharmacy. AS, CMA

## 2019-11-03 NOTE — Telephone Encounter (Signed)
Patient called states Pharmacy says no Rx for his Buspar refill-- Patient is now out completely and really needs----   explained to him provider is out of the office today but message will be sent to her nurse/ med asst for review & that it will be Monday before it can be addressed with "Dr. Jenetta Downer".  --forwarding refill request for:      Meds ordered this encounter  Medications   busPIRone (BUSPAR) 10 MG tablet    Sig: Take 0.5 tablets (5 mg total) by mouth 2 (two) times daily for 14 days, THEN 1 tablet (10 mg total) 2 (two) times daily for 14 days.    Dispense:  42 tablet    Refill:  0   Pls send refill order to:   [facility] [place], [place] [phone] (Phone) [phone] (Fax   --glh

## 2020-01-29 ENCOUNTER — Ambulatory Visit: Payer: 59 | Admitting: Family Medicine

## 2021-09-30 ENCOUNTER — Ambulatory Visit (INDEPENDENT_AMBULATORY_CARE_PROVIDER_SITE_OTHER): Payer: BC Managed Care – PPO | Admitting: Nurse Practitioner

## 2021-09-30 ENCOUNTER — Encounter: Payer: Self-pay | Admitting: Nurse Practitioner

## 2021-09-30 ENCOUNTER — Other Ambulatory Visit: Payer: Self-pay

## 2021-09-30 VITALS — BP 131/89 | HR 90 | Temp 98.3°F | Ht 74.0 in | Wt 218.0 lb

## 2021-09-30 DIAGNOSIS — M5116 Intervertebral disc disorders with radiculopathy, lumbar region: Secondary | ICD-10-CM | POA: Diagnosis not present

## 2021-09-30 MED ORDER — DICLOFENAC SODIUM 50 MG PO TBEC: 50.0000 mg | DELAYED_RELEASE_TABLET | Freq: Two times a day (BID) | ORAL | 1 refills | Status: DC

## 2021-09-30 NOTE — Progress Notes (Signed)
Acute Office Visit  Subjective:    Patient ID: Samuel Vasquez, male    DOB: [birth year], 40 y.o.   MRN: [id number]  Chief Complaint  Patient presents with   Acute Visit   Back Pain    The patient states that he was seen in emergency departments 1 September.  Was diagnosed with left-sided sciatica.  An x-ray was done of his left hip which was negative for any acute abnormalities.  Joint spaces were well-maintained.  Patient states that pain has worsened since then, but most severe in the last week.  Hurts much more with standing and walking.  Feels like electric shocks going down from his left hip to the left foot.  Weakness present when the sensation occurs.  Is relieved with rest.  He states that when treated in the ER was given prednisone taper which he does not feel like helped at all.  His work is requesting paperwork be filled out to allow for accommodations for him into account for any missed work due to the pain and new symptoms he is experiencing.  Back Pain This is a new problem. The current episode started 1 to 4 weeks ago. The problem occurs constantly. The problem is unchanged. The pain is present in the lumbar spine. The quality of the pain is described as burning, stabbing and shooting. The pain radiates to the left thigh, left foot and left knee. The pain is at a severity of 7/10. The pain is severe. The pain is Worse during the day (but does have pain all the time.). The symptoms are aggravated by standing, twisting, stress and bending. Associated symptoms include leg pain, numbness, paresthesias, pelvic pain, tingling and weakness. Pertinent negatives include no chest pain, dysuria, fever or headaches. Risk factors include poor posture. He has tried muscle relaxant, ice and analgesics (oral steroids) for the symptoms. The treatment provided mild relief.    Past Medical History:  Diagnosis Date   Chronic cervical pain 09/05/2013   Chronic neck pain    Family history of  hypertension 06/26/2016   Family history of mixed hyperlipidemia 06/26/2016   GAD (generalized anxiety disorder) 06/01/2016   Overweight (BMI 25.0-29.9) 06/01/2016   Panic attack as reaction to stress 06/01/2016   Vitamin D insufficiency 06/01/2016    Past Surgical History:  Procedure Laterality Date   TONSILLECTOMY      Family History  Problem Relation Age of Onset   Hyperlipidemia Mother    Hypertension Mother    Hypertension Brother     Social History   Socioeconomic History   Marital status: Single    Spouse name: Not on file   Number of children: Not on file   Years of education: Not on file   Highest education level: Not on file  Occupational History   Not on file  Tobacco Use   Smoking status: Every Day    Packs/day: 1.00    Types: Cigarettes    Last attempt to quit: 12/21/2010    Years since quitting: 10.7   Smokeless tobacco: Never  Vaping Use   Vaping Use: Never used  Substance and Sexual Activity   Alcohol use: Yes    Comment: socially   Drug use: No   Sexual activity: Yes    Birth control/protection: None  Other Topics Concern   Not on file  Social History Narrative   Not on file   Social Determinants of Health   Financial Resource Strain: Not on file  Food Insecurity: Not on  file  Transportation Needs: Not on file  Physical Activity: Not on file  Stress: Not on file  Social Connections: Not on file  Intimate Partner Violence: Not on file    Outpatient Medications Prior to Visit  Medication Sig Dispense Refill   ALPRAZolam (XANAX) 0.5 MG tablet take one tab po prn panic attack 30 tablet 0   Aspirin-Salicylamide-Caffeine (ARTHRITIS STRENGTH BC POWDER PO) Take by mouth as needed.     busPIRone (BUSPAR) 10 MG tablet Take 1 tablet (10 mg total) by mouth 3 (three) times daily. 90 tablet 0   FLUoxetine (PROZAC) 20 MG tablet TAKE 1 TABLET(20 MG) BY MOUTH DAILY 90 tablet 1   Vitamin D, Ergocalciferol, (DRISDOL) 1.25 MG (50000 UT) CAPS capsule Take 1  capsule (50,000 Units total) by mouth every 7 (seven) days. 12 capsule 6   No facility-administered medications prior to visit.    No Known Allergies  Review of Systems  Constitutional:  Positive for activity change and fatigue. Negative for chills and fever.       Patient reports decreased ability to participate in normal activities which he finds enjoyable due to the pain he is experiencing.  HENT:  Negative for congestion, postnasal drip, rhinorrhea, sinus pressure, sinus pain, sneezing and sore throat.   Eyes: Negative.   Respiratory:  Negative for cough, shortness of breath and wheezing.   Cardiovascular:  Negative for chest pain and palpitations.  Gastrointestinal:  Negative for constipation, diarrhea, nausea and vomiting.  Endocrine: Negative for cold intolerance, heat intolerance, polydipsia and polyuria.  Genitourinary:  Positive for pelvic pain. Negative for dysuria, frequency and urgency.  Musculoskeletal:  Positive for arthralgias, back pain and myalgias.       Patient reports diminished ability to stand for extended periods of time.  Walking is painful, especially with weightbearing on left foot and leg.  States that pain started from left hip all the way down into the left big toe.  Feels like electric shock.  Causes weakness in the leg.  Skin:  Negative for rash.  Allergic/Immunologic: Negative for environmental allergies.  Neurological:  Positive for tingling, weakness, numbness and paresthesias. Negative for dizziness and headaches.  Psychiatric/Behavioral:  The patient is not nervous/anxious.       Objective:    Physical Exam Vitals and nursing note reviewed.  Constitutional:      General: He is in acute distress.     Appearance: Normal appearance. He is well-developed.  HENT:     Head: Normocephalic and atraumatic.  Eyes:     Pupils: Pupils are equal, round, and reactive to light.  Cardiovascular:     Rate and Rhythm: Normal rate and regular rhythm.      Pulses: Normal pulses.     Heart sounds: Normal heart sounds.  Pulmonary:     Effort: Pulmonary effort is normal.     Breath sounds: Normal breath sounds.  Abdominal:     Palpations: Abdomen is soft.  Musculoskeletal:        General: Normal range of motion.     Cervical back: Normal range of motion and neck supple.     Comments: Patient with noticeable increase in pain when sitting up from a seated position.  Grimaces when placing weight on the left foot and leg.  Bending and twisting at the waist also increasing pain, evidenced by grimacing.  No bony abnormalities or deformities are palpated at this time.  Lymphadenopathy:     Cervical: No cervical adenopathy.  Skin:  General: Skin is warm and dry.     Capillary Refill: Capillary refill takes less than 2 seconds.  Neurological:     General: No focal deficit present.     Mental Status: He is alert and oriented to person, place, and time.  Psychiatric:        Mood and Affect: Mood normal.        Behavior: Behavior normal.        Thought Content: Thought content normal.        Judgment: Judgment normal.   Today's Vitals   09/30/21 1552  BP: 131/89  Pulse: 90  Temp: 98.3 F (36.8 C)  SpO2: 100%  Weight: 218 lb (98.9 kg)  Height: 6\' 2"  (1.88 m)   Body mass index is 27.99 kg/m.   Wt Readings from Last 3 Encounters:  09/30/21 218 lb (98.9 kg)  09/27/19 195 lb (88.5 kg)  08/15/19 195 lb (88.5 kg)    Health Maintenance Due  Topic Date Due   COVID-19 Vaccine (1) Never done   INFLUENZA VACCINE  Never done    There are no preventive care reminders to display for this patient.   Lab Results  Component Value Date   TSH 1.880 09/27/2019   Lab Results  Component Value Date   WBC 3.8 09/27/2019   HGB 16.1 09/27/2019   HCT 46.7 09/27/2019   MCV 93 09/27/2019   PLT 238 09/27/2019   Lab Results  Component Value Date   NA 139 09/27/2019   K 4.1 09/27/2019   CO2 23 09/27/2019   GLUCOSE 84 09/27/2019   BUN 12  09/27/2019   CREATININE 0.96 09/27/2019   BILITOT 0.5 09/27/2019   ALKPHOS 66 09/27/2019   AST 22 09/27/2019   ALT 17 09/27/2019   PROT 6.7 09/27/2019   ALBUMIN 4.3 09/27/2019   CALCIUM 9.1 09/27/2019   Lab Results  Component Value Date   CHOL 164 09/27/2019   Lab Results  Component Value Date   HDL 69 09/27/2019   Lab Results  Component Value Date   LDLCALC 83 09/27/2019   Lab Results  Component Value Date   TRIG 58 09/27/2019   Lab Results  Component Value Date   CHOLHDL 2.4 09/27/2019   Lab Results  Component Value Date   HGBA1C 4.9 09/27/2019       Assessment & Plan:  1. Lumbar disc disease with radiculopathy And diclofenac 50 mg tablets which may be taken up to twice daily as needed for pain and inflammation.  We will get x-ray of lumbar spine for further evaluation.  We will likely refer to orthopedic provider for continued management.  A work note which was written today keeping the patient out of work from today through 10/10/2021, putting him back to work at 10/13/2021.  FMLA paperwork to be filled out and support current recommendations. - DG Lumbar Spine 2-3 Views; Future - diclofenac (VOLTAREN) 50 MG EC tablet; Take 1 tablet (50 mg total) by mouth 2 (two) times daily.  Dispense: 60 tablet; Refill: 1   Problem List Items Addressed This Visit       Nervous and Auditory   Lumbar disc disease with radiculopathy - Primary   Relevant Medications   diclofenac (VOLTAREN) 50 MG EC tablet   Other Relevant Orders   DG Lumbar Spine 2-3 Views     Meds ordered this encounter  Medications   diclofenac (VOLTAREN) 50 MG EC tablet    Sig: Take 1 tablet (50 mg total)  by mouth 2 (two) times daily.    Dispense:  60 tablet    Refill:  1    Order Specific Question:   Supervising Provider    Answer:   Nani Gasser D [[id number]]   This note was dictated using Dragon Voice Recognition Software. Rapid proofreading was performed to expedite the delivery of the  information. Despite proofreading, phonetic errors will occur which are common with this voice recognition software. Please take this into consideration. If there are any concerns, please contact our office.     Carlean Jews, NP

## 2021-10-03 ENCOUNTER — Other Ambulatory Visit: Payer: Self-pay

## 2021-10-03 ENCOUNTER — Ambulatory Visit (HOSPITAL_BASED_OUTPATIENT_CLINIC_OR_DEPARTMENT_OTHER)
Admission: RE | Admit: 2021-10-03 | Discharge: 2021-10-03 | Disposition: A | Discharge status: Home / Self Care | Payer: BC Managed Care – PPO | Source: Ambulatory Visit | Attending: Nurse Practitioner | Admitting: Nurse Practitioner

## 2021-10-03 DIAGNOSIS — M5116 Intervertebral disc disorders with radiculopathy, lumbar region: Secondary | ICD-10-CM | POA: Insufficient documentation

## 2021-10-03 DIAGNOSIS — M545 Low back pain, unspecified: Secondary | ICD-10-CM | POA: Diagnosis not present

## 2021-10-05 DIAGNOSIS — M5116 Intervertebral disc disorders with radiculopathy, lumbar region: Secondary | ICD-10-CM | POA: Insufficient documentation

## 2021-10-07 NOTE — Progress Notes (Signed)
No acute abnoramality present. Will need MRI and/or referral to spieal specialist for further evaluation if pain and symptoms are persistent. Discuss with patient at visit 10/21.

## 2021-10-10 ENCOUNTER — Other Ambulatory Visit: Payer: Self-pay

## 2021-10-10 ENCOUNTER — Ambulatory Visit (INDEPENDENT_AMBULATORY_CARE_PROVIDER_SITE_OTHER): Payer: BC Managed Care – PPO | Admitting: Nurse Practitioner

## 2021-10-10 ENCOUNTER — Encounter: Payer: Self-pay | Admitting: Nurse Practitioner

## 2021-10-10 VITALS — BP 113/73 | HR 72 | Temp 98.4°F | Ht 74.0 in | Wt 223.2 lb

## 2021-10-10 DIAGNOSIS — Z6828 Body mass index (BMI) 28.0-28.9, adult: Secondary | ICD-10-CM

## 2021-10-10 DIAGNOSIS — M5116 Intervertebral disc disorders with radiculopathy, lumbar region: Secondary | ICD-10-CM

## 2021-10-10 NOTE — Progress Notes (Signed)
Established Patient Office Visit  Subjective:  Patient ID: Samuel Vasquez, male    DOB: 08-22-1981  Age: 40 y.o. MRN: [id number]  CC:  Chief Complaint  Patient presents with   Follow-up    HPI Samuel Vasquez presents for follow up lower back and left hip and leg pain. He has been out of work since his most recent visit. FMLA papers were completed for the time period up to and including today. The patient states that his pain is a little better. Still has tingling and weakness of the left leg. He did have X-ray of the lumbar spine done this was negative for acute abnormalities. I believe he needs to have an MRI and assessment per orthopedic/sports medicine provider. The patient is due to go back to work on Monday, 10/13/2021. I will keep him out of work an additional two weeks so that MRI can be ordered and orthopedic assessment can be scheduled.   Past Medical History:  Diagnosis Date   Chronic cervical pain 09/05/2013   Chronic neck pain    Family history of hypertension 06/26/2016   Family history of mixed hyperlipidemia 06/26/2016   GAD (generalized anxiety disorder) 06/01/2016   Overweight (BMI 25.0-29.9) 06/01/2016   Panic attack as reaction to stress 06/01/2016   Vitamin D insufficiency 06/01/2016    Past Surgical History:  Procedure Laterality Date   TONSILLECTOMY      Family History  Problem Relation Age of Onset   Hyperlipidemia Mother    Hypertension Mother    Hypertension Brother     Social History   Socioeconomic History   Marital status: Single    Spouse name: Not on file   Number of children: Not on file   Years of education: Not on file   Highest education level: Not on file  Occupational History   Not on file  Tobacco Use   Smoking status: Every Day    Packs/day: 1.00    Types: Cigarettes    Last attempt to quit: 12/21/2010    Years since quitting: 10.8   Smokeless tobacco: Never  Vaping Use   Vaping Use: Never used  Substance and Sexual Activity    Alcohol use: Yes    Comment: socially   Drug use: No   Sexual activity: Yes    Birth control/protection: None  Other Topics Concern   Not on file  Social History Narrative   Not on file   Social Determinants of Health   Financial Resource Strain: Not on file  Food Insecurity: Not on file  Transportation Needs: Not on file  Physical Activity: Not on file  Stress: Not on file  Social Connections: Not on file  Intimate Partner Violence: Not on file    Outpatient Medications Prior to Visit  Medication Sig Dispense Refill   ALPRAZolam (XANAX) 0.5 MG tablet take one tab po prn panic attack 30 tablet 0   Aspirin-Salicylamide-Caffeine (ARTHRITIS STRENGTH BC POWDER PO) Take by mouth as needed.     busPIRone (BUSPAR) 10 MG tablet Take 1 tablet (10 mg total) by mouth 3 (three) times daily. 90 tablet 0   diclofenac (VOLTAREN) 50 MG EC tablet Take 1 tablet (50 mg total) by mouth 2 (two) times daily. 60 tablet 1   FLUoxetine (PROZAC) 20 MG tablet TAKE 1 TABLET(20 MG) BY MOUTH DAILY 90 tablet 1   Vitamin D, Ergocalciferol, (DRISDOL) 1.25 MG (50000 UT) CAPS capsule Take 1 capsule (50,000 Units total) by mouth every 7 (seven) days. 12 capsule  6   No facility-administered medications prior to visit.    No Known Allergies  ROS Review of Systems  Constitutional:  Positive for activity change and fatigue. Negative for chills and fever.       Patient reports very little improvement in left hip and leg pain since his most recent visit.   HENT:  Negative for congestion, postnasal drip, rhinorrhea, sinus pressure, sinus pain, sneezing and sore throat.   Eyes: Negative.   Respiratory:  Negative for cough, shortness of breath and wheezing.   Cardiovascular:  Negative for chest pain and palpitations.  Gastrointestinal:  Negative for constipation, diarrhea, nausea and vomiting.  Endocrine: Negative for cold intolerance, heat intolerance, polydipsia and polyuria.  Genitourinary:  Negative for dysuria,  frequency and urgency.  Musculoskeletal:  Positive for arthralgias, back pain and myalgias.       Patient reports diminished ability to stand for extended periods of time.  Walking is painful, especially with weightbearing on left foot and leg.  States that pain started from left hip all the way down into the left big toe.  Feels like electric shock.  Causes weakness in the leg.  Skin:  Negative for rash.  Allergic/Immunologic: Negative for environmental allergies.  Neurological:  Positive for weakness and numbness. Negative for dizziness and headaches.  Psychiatric/Behavioral:  The patient is not nervous/anxious.      Objective:    Physical Exam Vitals and nursing note reviewed.  Constitutional:      Appearance: Normal appearance. He is well-developed.  HENT:     Head: Normocephalic and atraumatic.     Nose: Nose normal.     Mouth/Throat:     Mouth: Mucous membranes are moist.  Eyes:     Extraocular Movements: Extraocular movements intact.     Conjunctiva/sclera: Conjunctivae normal.     Pupils: Pupils are equal, round, and reactive to light.  Cardiovascular:     Rate and Rhythm: Normal rate and regular rhythm.     Pulses: Normal pulses.     Heart sounds: Normal heart sounds.  Pulmonary:     Effort: Pulmonary effort is normal.     Breath sounds: Normal breath sounds.  Abdominal:     Palpations: Abdomen is soft.  Musculoskeletal:        General: Normal range of motion.     Cervical back: Normal range of motion and neck supple.     Comments: Patient with noticeable increase in pain when sitting up from a seated position.  Grimaces when placing weight on the left foot and leg.  Bending and twisting at the waist also increasing pain, evidenced by grimacing.  No bony abnormalities or deformities are palpated at this time.    Lymphadenopathy:     Cervical: No cervical adenopathy.  Skin:    General: Skin is warm and dry.     Capillary Refill: Capillary refill takes less than 2  seconds.  Neurological:     General: No focal deficit present.     Mental Status: He is alert and oriented to person, place, and time.  Psychiatric:        Mood and Affect: Mood normal.        Behavior: Behavior normal.        Thought Content: Thought content normal.        Judgment: Judgment normal.   Today's Vitals   10/10/21 0914  BP: 113/73  Pulse: 72  Temp: 98.4 F (36.9 C)  SpO2: 100%  Weight: 223 lb 3.2  oz (101.2 kg)  Height: 6\' 2"  (1.88 m)   Body mass index is 28.66 kg/m.   Wt Readings from Last 3 Encounters:  10/10/21 223 lb 3.2 oz (101.2 kg)  09/30/21 218 lb (98.9 kg)  09/27/19 195 lb (88.5 kg)     Health Maintenance Due  Topic Date Due   COVID-19 Vaccine (1) Never done   Pneumococcal Vaccine 58-68 Years old (1 - PCV) Never done   INFLUENZA VACCINE  Never done    There are no preventive care reminders to display for this patient.  Lab Results  Component Value Date   TSH 1.880 09/27/2019   Lab Results  Component Value Date   WBC 3.8 09/27/2019   HGB 16.1 09/27/2019   HCT 46.7 09/27/2019   MCV 93 09/27/2019   PLT 238 09/27/2019   Lab Results  Component Value Date   NA 139 09/27/2019   K 4.1 09/27/2019   CO2 23 09/27/2019   GLUCOSE 84 09/27/2019   BUN 12 09/27/2019   CREATININE 0.96 09/27/2019   BILITOT 0.5 09/27/2019   ALKPHOS 66 09/27/2019   AST 22 09/27/2019   ALT 17 09/27/2019   PROT 6.7 09/27/2019   ALBUMIN 4.3 09/27/2019   CALCIUM 9.1 09/27/2019   Lab Results  Component Value Date   CHOL 164 09/27/2019   Lab Results  Component Value Date   HDL 69 09/27/2019   Lab Results  Component Value Date   LDLCALC 83 09/27/2019   Lab Results  Component Value Date   TRIG 58 09/27/2019   Lab Results  Component Value Date   CHOLHDL 2.4 09/27/2019   Lab Results  Component Value Date   HGBA1C 4.9 09/27/2019      Assessment & Plan:  1. Lumbar disc disease with radiculopathy Reviewed x-ray of lumbar spine showing no acute  abnormalities or deformities.  We will get MRI for further evaluation and refer to orthopedics.  Extend FMLA through 10/27/2021 so that we can get MRI and initial assessment with orthopedics can be completed.  We will see him back on or around 10/27/2021 to evaluate his readiness to return to work. - MR Lumbar Spine Wo Contrast; Future - Ambulatory referral to Orthopedic Surgery  2. Body mass index 28.0-28.9, adult Recommend he limit calorie intake to 2000 cal/day.  He should consume a low-fat, low-cholesterol diet and incorporate low impact exercise into his daily routine.  Problem List Items Addressed This Visit       Nervous and Auditory   Lumbar disc disease with radiculopathy - Primary   Relevant Orders   MR Lumbar Spine Wo Contrast   Ambulatory referral to Orthopedic Surgery   Other Visit Diagnoses     Body mass index 28.0-28.9, adult            Follow-up: Return in about 17 days (around 10/27/2021) for review MRI, determine readiness to return to work. 13/06/2021    Marland Kitchen, NP  This note was dictated using Carlean Jews. Rapid proofreading was performed to expedite the delivery of the information. Despite proofreading, phonetic errors will occur which are common with this voice recognition software. Please take this into consideration. If there are any concerns, please contact our office.  Also regular exercise always over there

## 2021-10-12 NOTE — Patient Instructions (Signed)

## 2021-10-25 ENCOUNTER — Ambulatory Visit
Admission: RE | Admit: 2021-10-25 | Discharge: 2021-10-25 | Disposition: A | Discharge status: Home / Self Care | Payer: BC Managed Care – PPO | Source: Ambulatory Visit | Attending: Nurse Practitioner | Admitting: Nurse Practitioner

## 2021-10-25 ENCOUNTER — Other Ambulatory Visit: Payer: Self-pay

## 2021-10-25 DIAGNOSIS — M5117 Intervertebral disc disorders with radiculopathy, lumbosacral region: Secondary | ICD-10-CM | POA: Diagnosis not present

## 2021-10-25 DIAGNOSIS — M5116 Intervertebral disc disorders with radiculopathy, lumbar region: Secondary | ICD-10-CM

## 2021-10-27 ENCOUNTER — Other Ambulatory Visit: Payer: Self-pay

## 2021-10-27 ENCOUNTER — Ambulatory Visit (INDEPENDENT_AMBULATORY_CARE_PROVIDER_SITE_OTHER): Payer: BC Managed Care – PPO | Admitting: Nurse Practitioner

## 2021-10-27 ENCOUNTER — Encounter: Payer: Self-pay | Admitting: Nurse Practitioner

## 2021-10-27 VITALS — BP 114/75 | HR 85 | Temp 98.6°F | Ht 74.0 in | Wt 224.0 lb

## 2021-10-27 DIAGNOSIS — M5116 Intervertebral disc disorders with radiculopathy, lumbar region: Secondary | ICD-10-CM | POA: Diagnosis not present

## 2021-10-27 DIAGNOSIS — R12 Heartburn: Secondary | ICD-10-CM | POA: Diagnosis not present

## 2021-10-27 DIAGNOSIS — Z6828 Body mass index (BMI) 28.0-28.9, adult: Secondary | ICD-10-CM | POA: Diagnosis not present

## 2021-10-27 MED ORDER — OMEPRAZOLE 20 MG PO CPDR: 20.0000 mg | DELAYED_RELEASE_CAPSULE | Freq: Two times a day (BID) | ORAL | 1 refills | Status: DC | PRN

## 2021-10-27 NOTE — Progress Notes (Signed)
Established Patient Office Visit  Subjective:  Patient ID: Samuel Vasquez, male    DOB: [birth year]  Age: 40 y.o. MRN: [id number]  CC:  Chief Complaint  Patient presents with   Follow-up    HPI Still has tingling and weakness of the left leg. This is worse with exertion. Has noted a few times with tingling and numbness of the right leg, but this has resolved.  Patient states that he does get some pain relief from taking doclofenac, but this causes him to have stomach upset and epigastric discomfort. Has not gotten much relief from muscle relaxers and steroid treatment. Is currently out of work due to this problem. He is scheduled to go back to work on 11/03/2021. He did have MRI of the lumbar spine on 10/25/2021. Results have not yet been released. He is scheduled to see orthopedic provider on 11/05/2021. He will need to continue to be out of work until he is evaluated and released by orthopedics. New paperwork will be completed and given to the patient's employer upon request .I will continue his FMLA/short term disability through 11/14/2021, allowing him to go back to work on 11/17/2021.   Back Pain This is a chronic problem. The current episode started more than 1 month ago. The problem occurs 2 to 4 times per day. The problem is unchanged. The pain is present in the lumbar spine. The quality of the pain is described as shooting, burning and stabbing. The pain radiates to the left thigh, left knee and left foot. The pain is at a severity of 6/10. The pain is The same all the time. The symptoms are aggravated by standing, twisting, stress and bending. Associated symptoms include leg pain, paresis, paresthesias and tingling. Pertinent negatives include no bladder incontinence, bowel incontinence, chest pain, dysuria, fever, headaches or weakness. Risk factors include obesity. He has tried analgesics, heat, ice, muscle relaxant and NSAIDs for the symptoms. The treatment provided no relief.     Past  Medical History:  Diagnosis Date   Chronic cervical pain 09/05/2013   Chronic neck pain    Family history of hypertension 06/26/2016   Family history of mixed hyperlipidemia 06/26/2016   GAD (generalized anxiety disorder) 06/01/2016   Overweight (BMI 25.0-29.9) 06/01/2016   Panic attack as reaction to stress 06/01/2016   Vitamin D insufficiency 06/01/2016    Past Surgical History:  Procedure Laterality Date   TONSILLECTOMY      Family History  Problem Relation Age of Onset   Hyperlipidemia Mother    Hypertension Mother    Hypertension Brother     Social History   Socioeconomic History   Marital status: Single    Spouse name: Not on file   Number of children: Not on file   Years of education: Not on file   Highest education level: Not on file  Occupational History   Not on file  Tobacco Use   Smoking status: Every Day    Packs/day: 1.00    Types: Cigarettes    Last attempt to quit: 12/21/2010    Years since quitting: 10.8   Smokeless tobacco: Never  Vaping Use   Vaping Use: Never used  Substance and Sexual Activity   Alcohol use: Yes    Comment: socially   Drug use: No   Sexual activity: Yes    Birth control/protection: None  Other Topics Concern   Not on file  Social History Narrative   Not on file   Social Determinants of Health  Financial Resource Strain: Not on file  Food Insecurity: Not on file  Transportation Needs: Not on file  Physical Activity: Not on file  Stress: Not on file  Social Connections: Not on file  Intimate Partner Violence: Not on file    Outpatient Medications Prior to Visit  Medication Sig Dispense Refill   ALPRAZolam (XANAX) 0.5 MG tablet take one tab po prn panic attack 30 tablet 0   Aspirin-Salicylamide-Caffeine (ARTHRITIS STRENGTH BC POWDER PO) Take by mouth as needed.     busPIRone (BUSPAR) 10 MG tablet Take 1 tablet (10 mg total) by mouth 3 (three) times daily. 90 tablet 0   diclofenac (VOLTAREN) 50 MG EC tablet Take 1 tablet  (50 mg total) by mouth 2 (two) times daily. 60 tablet 1   FLUoxetine (PROZAC) 20 MG tablet TAKE 1 TABLET(20 MG) BY MOUTH DAILY 90 tablet 1   Vitamin D, Ergocalciferol, (DRISDOL) 1.25 MG (50000 UT) CAPS capsule Take 1 capsule (50,000 Units total) by mouth every 7 (seven) days. 12 capsule 6   No facility-administered medications prior to visit.    No Known Allergies  ROS Review of Systems  Constitutional:  Negative for activity change, chills, fatigue and fever.       Patient reports very little improvement in left hip and leg pain since his most recent visit.    HENT:  Negative for congestion, postnasal drip, rhinorrhea, sinus pressure, sinus pain, sneezing and sore throat.   Eyes: Negative.   Respiratory:  Negative for cough, shortness of breath and wheezing.   Cardiovascular:  Negative for chest pain and palpitations.  Gastrointestinal:  Negative for bowel incontinence, constipation, diarrhea, nausea and vomiting.       NSAIDs causing upset stomach and heartburn   Endocrine: Negative for cold intolerance, heat intolerance, polydipsia and polyuria.  Genitourinary:  Negative for bladder incontinence, dysuria, frequency and urgency.  Musculoskeletal:  Positive for back pain and myalgias.        Patient reports diminished ability to stand for extended periods of time.  Walking is painful, especially with weightbearing on left foot and leg.  States that pain started from left hip all the way down into the left big toe.  Feels like electric shock.  Causes weakness in the leg.   Skin:  Negative for rash.  Allergic/Immunologic: Negative for environmental allergies.  Neurological:  Positive for tingling and paresthesias. Negative for dizziness, weakness and headaches.  Psychiatric/Behavioral:  The patient is not nervous/anxious.      Objective:    Physical Exam Vitals and nursing note reviewed.  Constitutional:      Appearance: Normal appearance. He is well-developed. He is obese.  HENT:      Head: Normocephalic and atraumatic.     Nose: Nose normal.     Mouth/Throat:     Mouth: Mucous membranes are moist.  Eyes:     Extraocular Movements: Extraocular movements intact.     Conjunctiva/sclera: Conjunctivae normal.     Pupils: Pupils are equal, round, and reactive to light.  Cardiovascular:     Rate and Rhythm: Normal rate and regular rhythm.     Pulses: Normal pulses.     Heart sounds: Normal heart sounds.  Pulmonary:     Effort: Pulmonary effort is normal.     Breath sounds: Normal breath sounds.  Abdominal:     Palpations: Abdomen is soft.  Musculoskeletal:        General: Normal range of motion.     Cervical back: Normal range  of motion and neck supple.     Comments: Patient continues to have an increase in pain when sitting up from a seated position.  Grimaces when placing weight on the left foot and leg.  Bending and twisting at the waist also increasing pain, evidenced by grimacing.  No bony abnormalities or deformities are palpated at this time.    Lymphadenopathy:     Cervical: No cervical adenopathy.  Skin:    General: Skin is warm and dry.     Capillary Refill: Capillary refill takes less than 2 seconds.  Neurological:     General: No focal deficit present.     Mental Status: He is alert and oriented to person, place, and time.  Psychiatric:        Mood and Affect: Mood normal.        Behavior: Behavior normal.        Thought Content: Thought content normal.        Judgment: Judgment normal.   Today's Vitals   10/27/21 1319  BP: 114/75  Pulse: 85  Temp: 98.6 F (37 C)  SpO2: 99%  Weight: 224 lb (101.6 kg)  Height: 6\' 2"  (1.88 m)   Body mass index is 28.76 kg/m.   Wt Readings from Last 3 Encounters:  10/27/21 224 lb (101.6 kg)  10/10/21 223 lb 3.2 oz (101.2 kg)  09/30/21 218 lb (98.9 kg)     Health Maintenance Due  Topic Date Due   Pneumococcal Vaccine 80-43 Years old (1 - PCV) Never done    There are no preventive care reminders  to display for this patient.  Lab Results  Component Value Date   TSH 1.880 09/27/2019   Lab Results  Component Value Date   WBC 3.8 09/27/2019   HGB 16.1 09/27/2019   HCT 46.7 09/27/2019   MCV 93 09/27/2019   PLT 238 09/27/2019   Lab Results  Component Value Date   NA 139 09/27/2019   K 4.1 09/27/2019   CO2 23 09/27/2019   GLUCOSE 84 09/27/2019   BUN 12 09/27/2019   CREATININE 0.96 09/27/2019   BILITOT 0.5 09/27/2019   ALKPHOS 66 09/27/2019   AST 22 09/27/2019   ALT 17 09/27/2019   PROT 6.7 09/27/2019   ALBUMIN 4.3 09/27/2019   CALCIUM 9.1 09/27/2019   Lab Results  Component Value Date   CHOL 164 09/27/2019   Lab Results  Component Value Date   HDL 69 09/27/2019   Lab Results  Component Value Date   LDLCALC 83 09/27/2019   Lab Results  Component Value Date   TRIG 58 09/27/2019   Lab Results  Component Value Date   CHOLHDL 2.4 09/27/2019   Lab Results  Component Value Date   HGBA1C 4.9 09/27/2019      Assessment & Plan:  1. Lumbar disc disease with radiculopathy Waiting for MRI results to be released.  Patient does have scheduled appointment with orthopedics 11/05/2021. I will continue his FMLA/short term disability through 11/14/2021, allowing him to go back to work on 11/17/2021.  Will complete additional paperwork as requested per employer.  He will need to be seen prior to his release to return to work.  Disability and accommodation recommendations may be turned over to orthopedics at that time..  2. Heartburn Will add omeprazole 20 mg twice daily as needed for heartburn related symptoms.  Mostly when patient takes NSAIDs for pain relief. - omeprazole (PRILOSEC) 20 MG capsule; Take 1 capsule (20 mg total) by mouth  2 (two) times daily as needed.  Dispense: 45 capsule; Refill: 1  3. Body mass index 28.0-28.9, adult Encourage patient to limit calorie intake to 2000 cal/day or less.  He should consume a low cholesterol, low-fat diet.  Patient  incorporate exercise into his daily routine.   Problem List Items Addressed This Visit       Nervous and Auditory   Lumbar disc disease with radiculopathy - Primary     Other   Heartburn   Relevant Medications   omeprazole (PRILOSEC) 20 MG capsule   Body mass index 28.0-28.9, adult    Meds ordered this encounter  Medications   omeprazole (PRILOSEC) 20 MG capsule    Sig: Take 1 capsule (20 mg total) by mouth 2 (two) times daily as needed.    Dispense:  45 capsule    Refill:  1    Order Specific Question:   Supervising Provider    Answer:   Nani Gasser D [[id number]]    Follow-up: Return in about 11 days (around 11/07/2021) for health maintenance exam, FBW at time of visit - add Free T4 and vitamin d - see below.    Carlean Jews, NP  This note was dictated using Conservation officer, historic buildings. Rapid proofreading was performed to expedite the delivery of the information. Despite proofreading, phonetic errors will occur which are common with this voice recognition software. Please take this into consideration. If there are any concerns, please contact our office.

## 2021-10-28 ENCOUNTER — Encounter: Payer: Self-pay | Admitting: Orthopaedic Surgery

## 2021-11-02 DIAGNOSIS — R12 Heartburn: Secondary | ICD-10-CM | POA: Insufficient documentation

## 2021-11-02 DIAGNOSIS — Z6829 Body mass index (BMI) 29.0-29.9, adult: Secondary | ICD-10-CM | POA: Insufficient documentation

## 2021-11-02 DIAGNOSIS — Z6828 Body mass index (BMI) 28.0-28.9, adult: Secondary | ICD-10-CM | POA: Insufficient documentation

## 2021-11-02 NOTE — Patient Instructions (Signed)
Fat and Cholesterol Restricted Eating Plan Getting too much fat and cholesterol in your diet may cause health problems. Choosing the right foods helps keep your fat and cholesterol at normal levels. This can keep you from getting certain diseases. Your doctor may recommend an eating plan that includes: Total fat: ______% or less of total calories a day. This is ______g of fat a day. Saturated fat: ______% or less of total calories a day. This is ______g of saturated fat a day. Cholesterol: less than _________mg a day. Fiber: ______g a day. What are tips for following this plan? General tips Work with your doctor to lose weight if you need to. Avoid: Foods with added sugar. Fried foods. Foods with trans fat or partially hydrogenated oils. This includes some margarines and baked goods. If you drink alcohol: Limit how much you have to: 0-1 drink a day for women who are not pregnant. 0-2 drinks a day for men. Know how much alcohol is in a drink. In the [place] one drink equals one 12 oz bottle of beer (355 mL), one 5 oz glass of wine (148 mL), or one 1 oz glass of hard liquor (44 mL). Reading food labels Check food labels for: Trans fats. Partially hydrogenated oils. Saturated fat (g) in each serving. Cholesterol (mg) in each serving. Fiber (g) in each serving. Choose foods with healthy fats, such as: Monounsaturated fats and polyunsaturated fats. These include olive and canola oil, flaxseeds, walnuts, almonds, and seeds. Omega-3 fats. These are found in certain fish, flaxseed oil, and ground flaxseeds. Choose grain products that have whole grains. Look for the word "whole" as the first word in the ingredient list. Cooking Cook foods using low-fat methods. These include baking, boiling, grilling, and broiling. Eat more home-cooked foods. Eat at restaurants and buffets less often. Eat less fast food. Avoid cooking using saturated fats, such as butter, cream, palm oil, palm kernel oil, and  coconut oil. Meal planning  At meals, divide your plate into four equal parts: Fill one-half of your plate with vegetables, green salads, and fruit. Fill one-fourth of your plate with whole grains. Fill one-fourth of your plate with low-fat (lean) protein foods. Eat fish that is high in omega-3 fats at least two times a week. This includes mackerel, tuna, sardines, and salmon. Eat foods that are high in fiber, such as whole grains, beans, apples, pears, berries, broccoli, carrots, peas, and barley. What foods should I eat? Fruits All fresh, canned (in natural juice), or frozen fruits. Vegetables Fresh or frozen vegetables (raw, steamed, roasted, or grilled). Green salads. Grains Whole grains, such as whole wheat or whole grain breads, crackers, cereals, and pasta. Unsweetened oatmeal, bulgur, barley, quinoa, or brown rice. Corn or whole wheat flour tortillas. Meats and other protein foods Ground beef (85% or leaner), grass-fed beef, or beef trimmed of fat. Skinless chicken or turkey. Ground chicken or turkey. Pork trimmed of fat. All fish and seafood. Egg whites. Dried beans, peas, or lentils. Unsalted nuts or seeds. Unsalted canned beans. Nut butters without added sugar or oil. Dairy Low-fat or nonfat dairy products, such as skim or 1% milk, 2% or reduced-fat cheeses, low-fat and fat-free ricotta or cottage cheese, or plain low-fat and nonfat yogurt. Fats and oils Tub margarine without trans fats. Light or reduced-fat mayonnaise and salad dressings. Avocado. Olive, canola, sesame, or safflower oils. The items listed above may not be a complete list of foods and beverages you can eat. Contact a dietitian for more information. What foods   should I avoid? Fruits Canned fruit in heavy syrup. Fruit in cream or butter sauce. Fried fruit. Vegetables Vegetables cooked in cheese, cream, or butter sauce. Fried vegetables. Grains White bread. White pasta. White rice. Cornbread. Bagels, pastries,  and croissants. Crackers and snack foods that contain trans fat and hydrogenated oils. Meats and other protein foods Fatty cuts of meat. Ribs, chicken wings, bacon, sausage, bologna, salami, chitterlings, fatback, hot dogs, bratwurst, and packaged lunch meats. Liver and organ meats. Whole eggs and egg yolks. Chicken and turkey with skin. Fried meat. Dairy Whole or 2% milk, cream, half-and-half, and cream cheese. Whole milk cheeses. Whole-fat or sweetened yogurt. Full-fat cheeses. Nondairy creamers and whipped toppings. Processed cheese, cheese spreads, and cheese curds. Fats and oils Butter, stick margarine, lard, shortening, ghee, or bacon fat. Coconut, palm kernel, and palm oils. Beverages Alcohol. Sugar-sweetened drinks such as sodas, lemonade, and fruit drinks. Sweets and desserts Corn syrup, sugars, honey, and molasses. Candy. Jam and jelly. Syrup. Sweetened cereals. Cookies, pies, cakes, donuts, muffins, and ice cream. The items listed above may not be a complete list of foods and beverages you should avoid. Contact a dietitian for more information. Summary Choosing the right foods helps keep your fat and cholesterol at normal levels. This can keep you from getting certain diseases. At meals, fill one-half of your plate with vegetables, green salads, and fruits. Eat high fiber foods, like whole grains, beans, apples, pears, berries, carrots, peas, and barley. Limit added sugar, saturated fats, alcohol, and fried foods. This information is not intended to replace advice given to you by your health care provider. Make sure you discuss any questions you have with your health care provider. Document Revised: 04/18/2021 Document Reviewed: 04/18/2021 Elsevier Patient Education  2022 [employer]  

## 2021-11-05 ENCOUNTER — Encounter: Payer: Self-pay | Admitting: Orthopaedic Surgery

## 2021-11-05 ENCOUNTER — Other Ambulatory Visit: Payer: Self-pay

## 2021-11-05 ENCOUNTER — Ambulatory Visit (INDEPENDENT_AMBULATORY_CARE_PROVIDER_SITE_OTHER): Payer: BC Managed Care – PPO | Admitting: Orthopaedic Surgery

## 2021-11-05 VITALS — BP 138/89 | Ht 74.0 in | Wt 224.0 lb

## 2021-11-05 DIAGNOSIS — M5116 Intervertebral disc disorders with radiculopathy, lumbar region: Secondary | ICD-10-CM

## 2021-11-05 MED ORDER — GABAPENTIN 100 MG PO CAPS: ORAL_CAPSULE | ORAL | 2 refills | Status: DC

## 2021-11-05 NOTE — Progress Notes (Signed)
Office Visit Note   Patient: Samuel Vasquez           Date of Birth: [birth year]           MRN: [id number] Visit Date: 11/05/2021              Requested by: Ronnell Freshwater, NP [place],  [place] [id number] PCP: Ronnell Freshwater, NP   Assessment & Plan: Visit Diagnoses:  1. Lumbar disc disease with radiculopathy     Plan: Patient still working and uses a standing forklift.  He works at [employer] x5 months.  We will start some Neurontin that he can take at night 100 mg for a few days and then increase to 200 mg at night.  MRI scan was reviewed.  Pathophysiology discussed.  I gave him a copy of the report.  Recheck 2 months.  We discussed possible epidural referral if he had increased symptoms.  Follow-Up Instructions: Return in about 2 months (around 01/05/2022).   Orders:  No orders of the defined types were placed in this encounter.  Meds ordered this encounter  Medications   gabapentin (NEURONTIN) 100 MG capsule    Sig: Take one at night for 7 days then 2 po q HS    Dispense:  60 capsule    Refill:  2      Procedures: No procedures performed   Clinical Data: No additional findings.   Subjective: Chief Complaint  Patient presents with   Lower Back - Pain    HPI Samuel Vasquez is a 40 year old male who patient seen for back pain.  Patient states he has back pain radiates down his left leg to his toes plantar surface of his foot.  He has had pain for couple months.  Patient's had plain radiographs and also MRI.  MRI shows small right paracentral disc protrusion with mass-effect on the right side.  Patient is having symptoms worse on the left.  Patient took some diclofenac and made him nauseated and he stopped.  Flexeril helped him sleep to some degree.  No chills or fever no bowel bladder symptoms.  Patient does have a family history of disc degeneration involving his mother and maternal grandmother.  Review of Systems positive for sleep difficulties  psychosomatic disorders.  Heartburn not currently symptomatic.  All other systems noncontributory HPI.   Objective: Vital Signs: BP 138/89   Ht 6\' 2"  (1.88 m)   Wt 224 lb (101.6 kg)   BMI 28.76 kg/m   Physical Exam Constitutional:      Appearance: He is well-developed.  HENT:     Head: Normocephalic and atraumatic.     Right Ear: External ear normal.     Left Ear: External ear normal.  Eyes:     Pupils: Pupils are equal, round, and reactive to light.  Neck:     Thyroid: No thyromegaly.     Trachea: No tracheal deviation.  Cardiovascular:     Rate and Rhythm: Normal rate.  Pulmonary:     Effort: Pulmonary effort is normal.     Breath sounds: No wheezing.  Abdominal:     General: Bowel sounds are normal.     Palpations: Abdomen is soft.  Musculoskeletal:     Cervical back: Neck supple.  Skin:    General: Skin is warm and dry.     Capillary Refill: Capillary refill takes less than 2 seconds.  Neurological:     Mental Status: He is alert and  oriented to person, place, and time.  Psychiatric:        Behavior: Behavior normal.        Thought Content: Thought content normal.        Judgment: Judgment normal.    Ortho Exam patient is able to heel and toe walk.  With single stance toe raises he does fatigue out quicker on the left than right and can do 4-5 before he fatigues.  Peritoneal strong lateral plantar surface of his foot decreased sensation.  Normal sensation on the right foot.  Specialty Comments:  No specialty comments available.  Imaging: CLINICAL DATA:  Intervertebral disc disorder. Patient reports lumbar disc disease with left leg radiculopathy, and notes that he injured himself from the repetitive motion of being up and down. No history of surgery reported.   EXAM: MRI LUMBAR SPINE WITHOUT CONTRAST   TECHNIQUE: Multiplanar, multisequence MR imaging of the lumbar spine was performed. No intravenous contrast was administered.   COMPARISON:  X-ray  lumbar 10/03/2021.   FINDINGS: Segmentation:  Standard.   Alignment:  Physiologic.   Vertebrae: No acute fracture, evidence of discitis, or aggressive bone lesion. Tiny Schmorl's node involving the T12-L1, L2, L3 and L4 vertebral bodies.   Conus medullaris and cauda equina: Conus extends to the L1 level. Conus and cauda equina appear normal.   Paraspinal and other soft tissues: No acute paraspinal abnormality.   Disc levels:   Disc spaces: Disc spaces are maintained.   T12-L1: No significant disc bulge. No neural foraminal stenosis. No central canal stenosis.   L1-L2: No significant disc bulge. No neural foraminal stenosis. No central canal stenosis.   L2-L3: No significant disc bulge. No neural foraminal stenosis. No central canal stenosis.   L3-L4: No significant disc bulge. No neural foraminal stenosis. No central canal stenosis.   L4-L5: No significant disc bulge. No neural foraminal stenosis. No central canal stenosis.   L5-S1: Small right paracentral disc protrusion with mass effect on the right intraspinal S1 nerve root. No foraminal or central canal stenosis.   IMPRESSION: 1. At L5-S1 there is a small right paracentral disc protrusion with mass effect on the right intraspinal S1 nerve root.     Electronically Signed   By: Kathreen Devoid M.D.   On: 10/28/2021 09:54     PMFS History: Patient Active Problem List   Diagnosis Date Noted   Heartburn 11/02/2021   Body mass index 28.0-28.9, adult 11/02/2021   Lumbar disc disease with radiculopathy 10/05/2021   Hypochondriasis 08/19/2019   Psychosomatic disorder 08/19/2019   Alopecia, male pattern 12/25/2016   Sleep difficulties 12/25/2016   HSV-1 infection 09/29/2016   Family history of hypertension 06/26/2016   Family history of mixed hyperlipidemia 06/26/2016   GAD (generalized anxiety disorder) 06/01/2016   Panic attack as reaction to stress 06/01/2016   Vitamin D insufficiency 06/01/2016    Overweight (BMI 25.0-29.9) 06/01/2016   Chronic cervical pain 09/05/2013   Past Medical History:  Diagnosis Date   Chronic cervical pain 09/05/2013   Chronic neck pain    Family history of hypertension 06/26/2016   Family history of mixed hyperlipidemia 06/26/2016   GAD (generalized anxiety disorder) 06/01/2016   Overweight (BMI 25.0-29.9) 06/01/2016   Panic attack as reaction to stress 06/01/2016   Vitamin D insufficiency 06/01/2016    Family History  Problem Relation Age of Onset   Hyperlipidemia Mother    Hypertension Mother    Hypertension Brother     Past Surgical History:  Procedure Laterality  Date   TONSILLECTOMY     Social History   Occupational History   Not on file  Tobacco Use   Smoking status: Every Day    Packs/day: 1.00    Types: Cigarettes    Last attempt to quit: 12/21/2010    Years since quitting: 10.8   Smokeless tobacco: Never  Vaping Use   Vaping Use: Never used  Substance and Sexual Activity   Alcohol use: Yes    Comment: socially   Drug use: No   Sexual activity: Yes    Birth control/protection: None

## 2021-11-12 ENCOUNTER — Encounter: Payer: Self-pay | Admitting: Nurse Practitioner

## 2021-11-12 ENCOUNTER — Other Ambulatory Visit: Payer: Self-pay

## 2021-11-12 ENCOUNTER — Ambulatory Visit (INDEPENDENT_AMBULATORY_CARE_PROVIDER_SITE_OTHER): Payer: BC Managed Care – PPO | Admitting: Nurse Practitioner

## 2021-11-12 VITALS — BP 117/81 | HR 69 | Temp 98.1°F | Ht 74.0 in | Wt 228.4 lb

## 2021-11-12 DIAGNOSIS — G479 Sleep disorder, unspecified: Secondary | ICD-10-CM

## 2021-11-12 DIAGNOSIS — M5116 Intervertebral disc disorders with radiculopathy, lumbar region: Secondary | ICD-10-CM | POA: Diagnosis not present

## 2021-11-12 DIAGNOSIS — F410 Panic disorder [episodic paroxysmal anxiety] without agoraphobia: Secondary | ICD-10-CM | POA: Diagnosis not present

## 2021-11-12 DIAGNOSIS — F430 Acute stress reaction: Secondary | ICD-10-CM

## 2021-11-12 DIAGNOSIS — Z0000 Encounter for general adult medical examination without abnormal findings: Secondary | ICD-10-CM

## 2021-11-12 DIAGNOSIS — E559 Vitamin D deficiency, unspecified: Secondary | ICD-10-CM

## 2021-11-12 DIAGNOSIS — F411 Generalized anxiety disorder: Secondary | ICD-10-CM

## 2021-11-12 DIAGNOSIS — Z0001 Encounter for general adult medical examination with abnormal findings: Secondary | ICD-10-CM | POA: Diagnosis not present

## 2021-11-12 MED ORDER — FLUOXETINE HCL 10 MG PO TABS: 10.0000 mg | ORAL_TABLET | Freq: Every day | ORAL | 2 refills | Status: DC

## 2021-11-12 MED ORDER — BUSPIRONE HCL 10 MG PO TABS: 5.0000 mg | ORAL_TABLET | Freq: Three times a day (TID) | ORAL | 0 refills | Status: DC

## 2021-11-12 MED ORDER — CYCLOBENZAPRINE HCL 10 MG PO TABS: 10.0000 mg | ORAL_TABLET | Freq: Two times a day (BID) | ORAL | 1 refills | Status: DC | PRN

## 2021-11-12 NOTE — Progress Notes (Signed)
Established Patient Office Visit  Subjective:  Patient ID: Samuel Vasquez, male    DOB: Oct 20, 1981  Age: 40 y.o. MRN: [id number]  CC:  Chief Complaint  Patient presents with   Annual Exam    HPI Samuel Vasquez presents for annual wellness visit. The patient continues to have left hip pain which radiates into the left leg and foot. He has tingling and pins and needles sensation in the left leg and foot. He has weakness in the left foot. Mri had been done just prior to his last visit. It did show: a small right paracentral disc protrusion with mass effect on the right intraspinal S1 nerve root.He did see orthopedics provider since his last visit.  Was started on low dose gabapentin. He is only able totake 1 capsule of this at night. Makes him sleepy so he doesn't take it during the day. Hard to tell if this is really helping. Follow up instructions were to follow up in 2 months if symptoms did not improve. Epidural injections were mentioned to be done, possibly, at that two month mark. This pain has been going on since September. Conservative measures with NSAIDs and steroids have not helped. He has been out of work for most of this time. Patient feels like orthopedic provider lacked empathy for the situation. The patient was hoping to have something done to improve pain so he could go back to work. 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 He also is required to lift heavy loads. This pain and weakness is really keeping him from returning to work. Currently, he is to go back to work on 11/17/2021, however, he has experienced no relief from the current pain and weakness.   Past Medical History:  Diagnosis Date   Chronic cervical pain 09/05/2013   Chronic neck pain    Family history of hypertension 06/26/2016   Family history of mixed hyperlipidemia 06/26/2016   GAD (generalized anxiety disorder) 06/01/2016   Overweight (BMI  25.0-29.9) 06/01/2016   Panic attack as reaction to stress 06/01/2016   Vitamin D insufficiency 06/01/2016    Past Surgical History:  Procedure Laterality Date   TONSILLECTOMY      Family History  Problem Relation Age of Onset   Hyperlipidemia Mother    Hypertension Mother    Hypertension Brother     Social History   Socioeconomic History   Marital status: Single    Spouse name: Not on file   Number of children: Not on file   Years of education: Not on file   Highest education level: Not on file  Occupational History   Not on file  Tobacco Use   Smoking status: Every Day    Packs/day: 1.00    Types: Cigarettes    Last attempt to quit: 12/21/2010    Years since quitting: 10.9   Smokeless tobacco: Never  Vaping Use   Vaping Use: Never used  Substance and Sexual Activity   Alcohol use: Yes    Comment: socially   Drug use: No   Sexual activity: Yes    Birth control/protection: None  Other Topics Concern   Not on file  Social History Narrative   Not on file   Social Determinants of Health   Financial Resource Strain: Not on file  Food Insecurity: Not on file  Transportation Needs: Not on file  Physical Activity: Not on file  Stress: Not  on file  Social Connections: Not on file  Intimate Partner Violence: Not on file    Outpatient Medications Prior to Visit  Medication Sig Dispense Refill   Aspirin-Salicylamide-Caffeine (ARTHRITIS STRENGTH BC POWDER PO) Take by mouth as needed.     gabapentin (NEURONTIN) 100 MG capsule Take one at night for 7 days then 2 po q HS 60 capsule 2   omeprazole (PRILOSEC) 20 MG capsule Take 1 capsule (20 mg total) by mouth 2 (two) times daily as needed. 45 capsule 1   Vitamin D, Ergocalciferol, (DRISDOL) 1.25 MG (50000 UT) CAPS capsule Take 1 capsule (50,000 Units total) by mouth every 7 (seven) days. 12 capsule 6   ALPRAZolam (XANAX) 0.5 MG tablet take one tab po prn panic attack 30 tablet 0   busPIRone (BUSPAR) 10 MG tablet Take 1  tablet (10 mg total) by mouth 3 (three) times daily. 90 tablet 0   cyclobenzaprine (FLEXERIL) 10 MG tablet Take 10 mg by mouth 2 (two) times daily as needed.     diclofenac (VOLTAREN) 50 MG EC tablet Take 1 tablet (50 mg total) by mouth 2 (two) times daily. 60 tablet 1   FLUoxetine (PROZAC) 20 MG tablet TAKE 1 TABLET(20 MG) BY MOUTH DAILY 90 tablet 1   No facility-administered medications prior to visit.    No Known Allergies  ROS Review of Systems  Constitutional:  Negative for activity change, chills, fatigue and fever.       Patient reports very little improvement in left hip and leg pain since his most recent visit.    HENT:  Negative for congestion, postnasal drip, rhinorrhea, sinus pressure, sinus pain, sneezing and sore throat.   Eyes: Negative.   Respiratory:  Negative for cough, shortness of breath and wheezing.   Cardiovascular:  Negative for chest pain and palpitations.  Gastrointestinal:  Negative for constipation, diarrhea, nausea and vomiting.       NSAIDs causing upset stomach and heartburn   Endocrine: Negative for cold intolerance, heat intolerance, polydipsia and polyuria.  Genitourinary:  Negative for dysuria, frequency and urgency.  Musculoskeletal:  Positive for back pain and myalgias.        Patient reports diminished ability to stand for extended periods of time.  Walking is painful, especially with weightbearing on left foot and leg.  States that pain started from left hip all the way down into the left big toe.  Feels like electric shock.  Causes weakness in the leg.   Skin:  Negative for rash.  Allergic/Immunologic: Negative for environmental allergies.  Neurological:  Positive for weakness and numbness. Negative for dizziness and headaches.  Psychiatric/Behavioral:  The patient is nervous/anxious.        Patient has been on multiple medications to treat depression and anxiety.  Has been off of the medications for some time.  Due to recent problems with work and  has leg, patient has started having increased anxiety.  Had like to start back on some treatment.     Objective:    Physical Exam Vitals and nursing note reviewed.  Constitutional:      Appearance: Normal appearance. He is well-developed. He is obese.  HENT:     Head: Normocephalic and atraumatic.     Right Ear: Tympanic membrane, ear canal and external ear normal.     Left Ear: Tympanic membrane, ear canal and external ear normal.     Nose: Nose normal.     Mouth/Throat:     Mouth: Mucous membranes are moist.  Pharynx: Oropharynx is clear.  Eyes:     Extraocular Movements: Extraocular movements intact.     Conjunctiva/sclera: Conjunctivae normal.     Pupils: Pupils are equal, round, and reactive to light.  Cardiovascular:     Rate and Rhythm: Normal rate and regular rhythm.     Pulses: Normal pulses.     Heart sounds: Normal heart sounds.  Pulmonary:     Effort: Pulmonary effort is normal.     Breath sounds: Normal breath sounds.  Abdominal:     General: Bowel sounds are normal. There is no distension.     Palpations: Abdomen is soft. There is no mass.     Tenderness: There is no abdominal tenderness. There is no guarding or rebound.     Hernia: No hernia is present.  Musculoskeletal:        General: Normal range of motion.     Cervical back: Normal range of motion and neck supple.     Comments: Patient continues to have an increase in pain when sitting up from a seated position.  Grimaces when placing weight on the left foot and leg.  Bending and twisting at the waist also increasing pain, evidenced by grimacing.  No bony abnormalities or deformities are palpated at this time.    Lymphadenopathy:     Cervical: No cervical adenopathy.  Skin:    General: Skin is warm and dry.     Capillary Refill: Capillary refill takes less than 2 seconds.  Neurological:     General: No focal deficit present.     Mental Status: He is alert and oriented to person, place, and time.   Psychiatric:        Attention and Perception: Attention and perception normal.        Mood and Affect: Affect normal. Mood is anxious and depressed.        Speech: Speech normal.        Behavior: Behavior normal. Behavior is cooperative.        Thought Content: Thought content normal.        Cognition and Memory: Cognition and memory normal.        Judgment: Judgment normal.    Today's Vitals   11/12/21 1436  BP: 117/81  Pulse: 69  Temp: 98.1 F (36.7 C)  SpO2: 98%  Weight: 228 lb 6.4 oz (103.6 kg)  Height: '6\' 2"'  (1.88 m)   Body mass index is 29.32 kg/m.   Wt Readings from Last 3 Encounters:  11/12/21 228 lb 6.4 oz (103.6 kg)  11/05/21 224 lb (101.6 kg)  10/27/21 224 lb (101.6 kg)     Health Maintenance Due  Topic Date Due   COVID-19 Vaccine (1) Never done   Pneumococcal Vaccine 23-14 Years old (1 - PCV) Never done    There are no preventive care reminders to display for this patient.  Lab Results  Component Value Date   TSH 1.880 09/27/2019   Lab Results  Component Value Date   WBC 3.8 09/27/2019   HGB 16.1 09/27/2019   HCT 46.7 09/27/2019   MCV 93 09/27/2019   PLT 238 09/27/2019   Lab Results  Component Value Date   NA 139 09/27/2019   K 4.1 09/27/2019   CO2 23 09/27/2019   GLUCOSE 84 09/27/2019   BUN 12 09/27/2019   CREATININE 0.96 09/27/2019   BILITOT 0.5 09/27/2019   ALKPHOS 66 09/27/2019   AST 22 09/27/2019   ALT 17 09/27/2019   PROT 6.7  09/27/2019   ALBUMIN 4.3 09/27/2019   CALCIUM 9.1 09/27/2019   Lab Results  Component Value Date   CHOL 164 09/27/2019   Lab Results  Component Value Date   HDL 69 09/27/2019   Lab Results  Component Value Date   LDLCALC 83 09/27/2019   Lab Results  Component Value Date   TRIG 58 09/27/2019   Lab Results  Component Value Date   CHOLHDL 2.4 09/27/2019   Lab Results  Component Value Date   HGBA1C 4.9 09/27/2019      Assessment & Plan:  1. Encounter for general adult medical examination  with abnormal findings Annual wellness visit today.  2. Lumbar disc disease with radiculopathy Patient to see orthopedic provider earlier this month.  Was given prescription for gabapentin to take as needed.  Patient has been unable to take this at night.  Has really been unable to determine if this is helped as it makes him sleepy.  Patient was advised to come back in a couple of months if symptoms are persistent.  Since patient is already been out of work for at least 2 months, will refer to new orthopedic provider for a second opinion.  We will give new prescription for Flexeril 10 mg to take up to twice daily as needed for tightness and pain.  We will extend his FMLA until he is seen by new orthopedic provider and be given new plan of treatment.  Paperwork will be completed and given back to patient as soon as possible. - cyclobenzaprine (FLEXERIL) 10 MG tablet; Take 1 tablet (10 mg total) by mouth 2 (two) times daily as needed.  Dispense: 45 tablet; Refill: 1 - Ambulatory referral to Orthopedic Surgery  3. Panic attack as reaction to stress Restart fluoxetine 10 mg daily.  We will also add back BuSpar 10 mg.  She may take 1/2 to 1 tablet up to 3 times daily as needed for acute anxiety.  We will reassess anxiety depression at next visit. - FLUoxetine (PROZAC) 10 MG tablet; Take 1 tablet (10 mg total) by mouth daily.  Dispense: 30 tablet; Refill: 2  4. GAD (generalized anxiety disorder) Restart fluoxetine 10 mg daily.  We will also add back BuSpar 10 mg.  She may take 1/2 to 1 tablet up to 3 times daily as needed for acute anxiety.  We will reassess anxiety depression at next visit. - busPIRone (BUSPAR) 10 MG tablet; Take 0.5-1 tablets (5-10 mg total) by mouth 3 (three) times daily.  Dispense: 90 tablet; Refill: 0 - FLUoxetine (PROZAC) 10 MG tablet; Take 1 tablet (10 mg total) by mouth daily.  Dispense: 30 tablet; Refill: 2  5. Vitamin D insufficiency Check vitamin D level and treat deficiency  as indicated. - Vitamin D (25 hydroxy); Future - Vitamin D (25 hydroxy)  6. Other fatigue Check routine, fasting labs for further evaluation. - CBC with Differential/Platelet; Future - Comp Met (CMET); Future - TSH; Future - Lipid panel; Future - Hemoglobin A1c; Future - T4, free; Future - T4, free - Hemoglobin A1c - Lipid panel - TSH - Comp Met (CMET) - CBC with Differential/Platelet  7. Health care maintenance Routine, fasting labs drawn during today's visit. - CBC with Differential/Platelet; Future - Comp Met (CMET); Future - TSH; Future - Lipid panel; Future - Hemoglobin A1c; Future - T4, free; Future - Vitamin D (25 hydroxy); Future - T4, free - Hemoglobin A1c - Lipid panel - TSH - Comp Met (CMET) - CBC with Differential/Platelet - Vitamin  D (25 hydroxy)    Problem List Items Addressed This Visit       Nervous and Auditory   Lumbar disc disease with radiculopathy   Relevant Medications   busPIRone (BUSPAR) 10 MG tablet   cyclobenzaprine (FLEXERIL) 10 MG tablet   FLUoxetine (PROZAC) 10 MG tablet   Other Relevant Orders   Ambulatory referral to Orthopedic Surgery     Other   GAD (generalized anxiety disorder) (Chronic)   Relevant Medications   busPIRone (BUSPAR) 10 MG tablet   FLUoxetine (PROZAC) 10 MG tablet   Panic attack as reaction to stress (Chronic)   Relevant Medications   busPIRone (BUSPAR) 10 MG tablet   FLUoxetine (PROZAC) 10 MG tablet   Vitamin D insufficiency (Chronic)   Relevant Orders   Vitamin D (25 hydroxy)   Sleep difficulties (Chronic)   Relevant Orders   CBC with Differential/Platelet   Comp Met (CMET)   TSH   Lipid panel   Hemoglobin A1c   T4, free   Other Visit Diagnoses     Encounter for general adult medical examination with abnormal findings    -  Primary   Health care maintenance       Relevant Orders   CBC with Differential/Platelet   Comp Met (CMET)   TSH   Lipid panel   Hemoglobin A1c   T4, free   Vitamin  D (25 hydroxy)       Meds ordered this encounter  Medications   busPIRone (BUSPAR) 10 MG tablet    Sig: Take 0.5-1 tablets (5-10 mg total) by mouth 3 (three) times daily.    Dispense:  90 tablet    Refill:  0    Order Specific Question:   Supervising Provider    Answer:   Beatrice Lecher D [[id number]]   cyclobenzaprine (FLEXERIL) 10 MG tablet    Sig: Take 1 tablet (10 mg total) by mouth 2 (two) times daily as needed.    Dispense:  45 tablet    Refill:  1    Order Specific Question:   Supervising Provider    Answer:   Beatrice Lecher D [[id number]]   FLUoxetine (PROZAC) 10 MG tablet    Sig: Take 1 tablet (10 mg total) by mouth daily.    Dispense:  30 tablet    Refill:  2    Order Specific Question:   Supervising Provider    Answer:   Beatrice Lecher D [[id number]]    Follow-up: Return in about 3 weeks (around 12/03/2021) for review orhto results. discuss readiness to go back to work. Marland Kitchen    Ronnell Freshwater, NP  This note was dictated using Systems analyst. Rapid proofreading was performed to expedite the delivery of the information. Despite proofreading, phonetic errors will occur which are common with this voice recognition software. Please take this into consideration. If there are any concerns, please contact our office.

## 2021-11-12 NOTE — Progress Notes (Signed)
Reviewed with patient during visit. Was also reviewed during visit with orthopedics.

## 2021-11-17 ENCOUNTER — Other Ambulatory Visit: Payer: Self-pay

## 2021-11-17 ENCOUNTER — Other Ambulatory Visit: Payer: BC Managed Care – PPO

## 2021-11-17 DIAGNOSIS — Z0000 Encounter for general adult medical examination without abnormal findings: Secondary | ICD-10-CM | POA: Diagnosis not present

## 2021-11-17 DIAGNOSIS — G479 Sleep disorder, unspecified: Secondary | ICD-10-CM | POA: Diagnosis not present

## 2021-11-17 DIAGNOSIS — E559 Vitamin D deficiency, unspecified: Secondary | ICD-10-CM | POA: Diagnosis not present

## 2021-11-18 LAB — LIPID PANEL
Chol/HDL Ratio: 2.6 ratio (ref 0.0–5.0)
Cholesterol, Total: 222 mg/dL — ABNORMAL HIGH (ref 100–199)
HDL: 85 mg/dL (ref >39)
LDL Chol Calc (NIH): 120 mg/dL — ABNORMAL HIGH (ref 0–99)
Triglycerides: 96 mg/dL (ref 0–149)
VLDL Cholesterol Cal: 17 mg/dL (ref 5–40)

## 2021-11-18 LAB — COMPREHENSIVE METABOLIC PANEL
ALT: 25 IU/L (ref 0–44)
AST: 19 IU/L (ref 0–40)
Albumin/Globulin Ratio: 1.7 (ref 1.2–2.2)
Albumin: 4.6 g/dL (ref 4.0–5.0)
Alkaline Phosphatase: 57 IU/L (ref 44–121)
BUN/Creatinine Ratio: 25 — ABNORMAL HIGH (ref 9–20)
BUN: 23 mg/dL (ref 6–24)
Bilirubin Total: 0.5 mg/dL (ref 0.0–1.2)
CO2: 21 mmol/L (ref 20–29)
Calcium: 9.6 mg/dL (ref 8.7–10.2)
Chloride: 104 mmol/L (ref 96–106)
Creatinine, Ser: 0.91 mg/dL (ref 0.76–1.27)
Globulin, Total: 2.7 g/dL (ref 1.5–4.5)
Glucose: 94 mg/dL (ref 70–99)
Potassium: 4.3 mmol/L (ref 3.5–5.2)
Sodium: 143 mmol/L (ref 134–144)
Total Protein: 7.3 g/dL (ref 6.0–8.5)
eGFR: 109 mL/min/{1.73_m2} (ref >59)

## 2021-11-18 LAB — CBC WITH DIFFERENTIAL/PLATELET
Basophils Absolute: 0 10*3/uL (ref 0.0–0.2)
Basos: 1 %
EOS (ABSOLUTE): 0.1 10*3/uL (ref 0.0–0.4)
Eos: 3 %
Hematocrit: 47.8 % (ref 37.5–51.0)
Hemoglobin: 16.3 g/dL (ref 13.0–17.7)
Immature Grans (Abs): 0 10*3/uL (ref 0.0–0.1)
Immature Granulocytes: 0 %
Lymphocytes Absolute: 1.2 10*3/uL (ref 0.7–3.1)
Lymphs: 29 %
MCH: 31.9 pg (ref 26.6–33.0)
MCHC: 34.1 g/dL (ref 31.5–35.7)
MCV: 94 fL (ref 79–97)
Monocytes Absolute: 0.5 10*3/uL (ref 0.1–0.9)
Monocytes: 11 %
Neutrophils Absolute: 2.3 10*3/uL (ref 1.4–7.0)
Neutrophils: 56 %
Platelets: 264 10*3/uL (ref 150–450)
RBC: 5.11 x10E6/uL (ref 4.14–5.80)
RDW: 12.4 % (ref 11.6–15.4)
WBC: 4.1 10*3/uL (ref 3.4–10.8)

## 2021-11-18 LAB — T4, FREE: Free T4: 1.32 ng/dL (ref 0.82–1.77)

## 2021-11-18 LAB — VITAMIN D 25 HYDROXY (VIT D DEFICIENCY, FRACTURES): Vit D, 25-Hydroxy: 22.9 ng/mL — ABNORMAL LOW (ref 30.0–100.0)

## 2021-11-18 LAB — HEMOGLOBIN A1C
Est. average glucose Bld gHb Est-mCnc: 100 mg/dL
Hgb A1c MFr Bld: 5.1 % (ref 4.8–5.6)

## 2021-11-18 LAB — TSH: TSH: 3.88 u[IU]/mL (ref 0.450–4.500)

## 2021-11-24 ENCOUNTER — Other Ambulatory Visit: Payer: Self-pay | Admitting: Nurse Practitioner

## 2021-11-24 DIAGNOSIS — E559 Vitamin D deficiency, unspecified: Secondary | ICD-10-CM

## 2021-11-24 MED ORDER — VITAMIN D (ERGOCALCIFEROL) 1.25 MG (50000 UNIT) PO CAPS: 50000.0000 [IU] | ORAL_CAPSULE | ORAL | 6 refills | Status: DC

## 2021-11-24 NOTE — Progress Notes (Signed)
Please let the patient know that recent labs showed vitamin d deficiency. I have sent drisdol, high dose vitamin d, once weekly for next six months. Also, bad and total cholesterol were a bit elevated. He should limit the intake of fried and fatty foods and increase exercise when possible. Other labs were good. Thanks -HB

## 2021-12-03 ENCOUNTER — Ambulatory Visit (INDEPENDENT_AMBULATORY_CARE_PROVIDER_SITE_OTHER): Payer: BC Managed Care – PPO | Admitting: Nurse Practitioner

## 2021-12-03 ENCOUNTER — Other Ambulatory Visit: Payer: Self-pay

## 2021-12-03 ENCOUNTER — Encounter: Payer: Self-pay | Admitting: Nurse Practitioner

## 2021-12-03 VITALS — BP 137/84 | HR 95 | Temp 98.1°F | Ht 74.0 in | Wt 222.1 lb

## 2021-12-03 DIAGNOSIS — Z6828 Body mass index (BMI) 28.0-28.9, adult: Secondary | ICD-10-CM

## 2021-12-03 DIAGNOSIS — M5116 Intervertebral disc disorders with radiculopathy, lumbar region: Secondary | ICD-10-CM

## 2021-12-03 DIAGNOSIS — E559 Vitamin D deficiency, unspecified: Secondary | ICD-10-CM

## 2021-12-03 DIAGNOSIS — Z789 Other specified health status: Secondary | ICD-10-CM | POA: Diagnosis not present

## 2021-12-03 DIAGNOSIS — F411 Generalized anxiety disorder: Secondary | ICD-10-CM | POA: Diagnosis not present

## 2021-12-03 DIAGNOSIS — F430 Acute stress reaction: Secondary | ICD-10-CM

## 2021-12-03 DIAGNOSIS — F410 Panic disorder [episodic paroxysmal anxiety] without agoraphobia: Secondary | ICD-10-CM

## 2021-12-03 MED ORDER — FLUOXETINE HCL 20 MG PO TABS: 20.0000 mg | ORAL_TABLET | Freq: Every day | ORAL | 1 refills | Status: DC

## 2021-12-03 NOTE — Progress Notes (Signed)
Established Patient Office Visit  Subjective:  Patient ID: Samuel Vasquez, male    DOB: 07/31/1981  Age: 40 y.o. MRN: [id number]  CC:  Chief Complaint  Patient presents with   Follow-up    HPI Samuel Vasquez presents for follow up visit. Was started back on fluoxetine daily and buspar as needed. He states that he is doing ok. Still feels nervous quite often and worries frequently. He has been on higher doses of fluoxetine in the past. Did well on higher doses.  He had routine, fasting labs done prior to this visit. LDL and total cholesterol were mildly elevated.   Lipid Panel     Component Value Date/Time   CHOL 222 (H) 11/17/2021 0854   TRIG 96 11/17/2021 0854   HDL 85 11/17/2021 0854   CHOLHDL 2.6 11/17/2021 0854   CHOLHDL 2.5 06/26/2016 0919   VLDL 9 06/26/2016 0919   LDLCALC 120 (H) 11/17/2021 0854   LABVLDL 17 11/17/2021 0854    He has already started to make dietary changes to help lower these numbers. Eating more fresh foods rather that are lean proteins and avoiding fried and fatty foods. He is having trouble with physical activity due to left leg weakness and pain from lumbar disc disorder. I did recommend he have second opinion for this and he has not heard back for initial appointment. He states that he is taking gabapentin at night which is helping settle the leg so he can rest better. He is supposed to go back to work tomorrow, however, without treatment, I will need to extend his time off.  He has no new concerns or complaints today.   Past Medical History:  Diagnosis Date   Chronic cervical pain 09/05/2013   Chronic neck pain    Family history of hypertension 06/26/2016   Family history of mixed hyperlipidemia 06/26/2016   GAD (generalized anxiety disorder) 06/01/2016   Overweight (BMI 25.0-29.9) 06/01/2016   Panic attack as reaction to stress 06/01/2016   Vitamin D insufficiency 06/01/2016    Past Surgical History:  Procedure Laterality Date   TONSILLECTOMY       Family History  Problem Relation Age of Onset   Hyperlipidemia Mother    Hypertension Mother    Hypertension Brother     Social History   Socioeconomic History   Marital status: Single    Spouse name: Not on file   Number of children: Not on file   Years of education: Not on file   Highest education level: Not on file  Occupational History   Not on file  Tobacco Use   Smoking status: Every Day    Packs/day: 1.00    Types: Cigarettes    Last attempt to quit: 12/21/2010    Years since quitting: 10.9   Smokeless tobacco: Never  Vaping Use   Vaping Use: Never used  Substance and Sexual Activity   Alcohol use: Yes    Comment: socially   Drug use: No   Sexual activity: Yes    Birth control/protection: None  Other Topics Concern   Not on file  Social History Narrative   Not on file   Social Determinants of Health   Financial Resource Strain: Not on file  Food Insecurity: Not on file  Transportation Needs: Not on file  Physical Activity: Not on file  Stress: Not on file  Social Connections: Not on file  Intimate Partner Violence: Not on file    Outpatient Medications Prior to Visit  Medication Sig  Dispense Refill   Aspirin-Salicylamide-Caffeine (ARTHRITIS STRENGTH BC POWDER PO) Take by mouth as needed.     busPIRone (BUSPAR) 10 MG tablet Take 0.5-1 tablets (5-10 mg total) by mouth 3 (three) times daily. 90 tablet 0   cyclobenzaprine (FLEXERIL) 10 MG tablet Take 1 tablet (10 mg total) by mouth 2 (two) times daily as needed. 45 tablet 1   cyclobenzaprine (FLEXERIL) 10 MG tablet Take 1 tablet by mouth 2 (two) times daily as needed.     gabapentin (NEURONTIN) 100 MG capsule Take one at night for 7 days then 2 po q HS 60 capsule 2   omeprazole (PRILOSEC) 20 MG capsule Take 1 capsule (20 mg total) by mouth 2 (two) times daily as needed. 45 capsule 1   Vitamin D, Ergocalciferol, (DRISDOL) 1.25 MG (50000 UNIT) CAPS capsule Take 1 capsule (50,000 Units total) by mouth every  7 (seven) days. 12 capsule 6   FLUoxetine (PROZAC) 10 MG tablet Take 1 tablet (10 mg total) by mouth daily. 30 tablet 2   No facility-administered medications prior to visit.    No Known Allergies  ROS Review of Systems  Constitutional:  Negative for activity change, chills, fatigue and fever.  HENT:  Negative for congestion, postnasal drip, rhinorrhea, sinus pressure, sinus pain, sneezing and sore throat.   Eyes: Negative.   Respiratory:  Negative for cough, shortness of breath and wheezing.   Cardiovascular:  Negative for chest pain and palpitations.  Gastrointestinal:  Negative for constipation, diarrhea, nausea and vomiting.  Endocrine: Negative for cold intolerance, heat intolerance, polydipsia and polyuria.  Genitourinary:  Negative for dysuria, frequency and urgency.  Musculoskeletal:  Positive for arthralgias and back pain. Negative for myalgias.       Atient reports diminished ability to stand for extended periods of time.  Walking is painful, especially with weightbearing on left foot and leg.  States that pain started from left hip all the way down into the left big toe.  Feels like electric shock.  Causes weakness in the leg.  Hs had little improvement in symptoms   Skin:  Negative for rash.  Allergic/Immunologic: Negative for environmental allergies.  Neurological:  Negative for dizziness, weakness and headaches.  Psychiatric/Behavioral:  The patient is not nervous/anxious.        Improved anxiety since starting back on prozac      Objective:    Physical Exam Vitals and nursing note reviewed.  Constitutional:      Appearance: Normal appearance. He is well-developed.  HENT:     Head: Normocephalic and atraumatic.     Nose: Nose normal.     Mouth/Throat:     Mouth: Mucous membranes are moist.  Eyes:     Extraocular Movements: Extraocular movements intact.     Conjunctiva/sclera: Conjunctivae normal.     Pupils: Pupils are equal, round, and reactive to light.   Cardiovascular:     Rate and Rhythm: Normal rate and regular rhythm.     Pulses: Normal pulses.     Heart sounds: Normal heart sounds.  Pulmonary:     Effort: Pulmonary effort is normal.     Breath sounds: Normal breath sounds.  Abdominal:     Palpations: Abdomen is soft.  Musculoskeletal:        General: Normal range of motion.     Cervical back: Normal range of motion and neck supple.     Comments: Patient continues to have an increase in pain when sitting up from a seated position.  Grimaces  when placing weight on the left foot and leg.  There is decreased strength with flexion and plantar flexion of the left foot. Bending and twisting at the waist also increasing pain, evidenced by grimacing.  No bony abnormalities or deformities are palpated at this time.   Lymphadenopathy:     Cervical: No cervical adenopathy.  Skin:    General: Skin is warm and dry.     Capillary Refill: Capillary refill takes less than 2 seconds.  Neurological:     General: No focal deficit present.     Mental Status: He is alert and oriented to person, place, and time.  Psychiatric:        Attention and Perception: Attention and perception normal.        Mood and Affect: Affect normal. Mood is anxious.        Behavior: Behavior normal. Behavior is cooperative.        Thought Content: Thought content normal.        Cognition and Memory: Cognition and memory normal.        Judgment: Judgment normal.    Today's Vitals   12/03/21 0912  BP: 137/84  Pulse: 95  Temp: 98.1 F (36.7 C)  SpO2: 98%  Weight: 222 lb 1.6 oz (100.7 kg)  Height: '6\' 2"'  (1.88 m)   Body mass index is 28.52 kg/m.   Wt Readings from Last 3 Encounters:  12/03/21 222 lb 1.6 oz (100.7 kg)  11/12/21 228 lb 6.4 oz (103.6 kg)  11/05/21 224 lb (101.6 kg)     Health Maintenance Due  Topic Date Due   COVID-19 Vaccine (1) Never done   Pneumococcal Vaccine 39-25 Years old (1 - PCV) Never done    There are no preventive care  reminders to display for this patient.  Lab Results  Component Value Date   TSH 3.880 11/17/2021   Lab Results  Component Value Date   WBC 4.1 11/17/2021   HGB 16.3 11/17/2021   HCT 47.8 11/17/2021   MCV 94 11/17/2021   PLT 264 11/17/2021   Lab Results  Component Value Date   NA 143 11/17/2021   K 4.3 11/17/2021   CO2 21 11/17/2021   GLUCOSE 94 11/17/2021   BUN 23 11/17/2021   CREATININE 0.91 11/17/2021   BILITOT 0.5 11/17/2021   ALKPHOS 57 11/17/2021   AST 19 11/17/2021   ALT 25 11/17/2021   PROT 7.3 11/17/2021   ALBUMIN 4.6 11/17/2021   CALCIUM 9.6 11/17/2021   EGFR 109 11/17/2021   Lab Results  Component Value Date   CHOL 222 (H) 11/17/2021   Lab Results  Component Value Date   HDL 85 11/17/2021   Lab Results  Component Value Date   LDLCALC 120 (H) 11/17/2021   Lab Results  Component Value Date   TRIG 96 11/17/2021   Lab Results  Component Value Date   CHOLHDL 2.6 11/17/2021   Lab Results  Component Value Date   HGBA1C 5.1 11/17/2021      Assessment & Plan:  1. Lumbar disc disease with radiculopathy No change in patient's status.  Moderate radiculopathy and weakness in the left leg.  Waiting on second orthopedic opinion.  Referral has been made.  An extension of FMLA made through 01/09/2022, allowing him to go back to work on 01/12/2022 pending evaluation from new orthopedic provider.  A work note was given for patient to get to his employer.  Will complete further FMLA paperwork when it is received.  2.  Panic attack as reaction to stress Improving.  Increase Prozac to 20 mg daily.  May continue BuSpar 10 mg twice daily as needed for acute anxiety.  At next visit in 1 month. - FLUoxetine (PROZAC) 20 MG tablet; Take 1 tablet (20 mg total) by mouth daily.  Dispense: 30 tablet; Refill: 1  3. GAD (generalized anxiety disorder) Improving.  Increase Prozac to 20 mg daily.  May continue BuSpar 10 mg twice daily as needed for acute anxiety.  At next visit in  1 month. - FLUoxetine (PROZAC) 20 MG tablet; Take 1 tablet (20 mg total) by mouth daily.  Dispense: 30 tablet; Refill: 1  4. LDL-c greater than or equal to 100 mg/dl Reviewed blood work with patient showing LDL of 120.  Advised him to limit intake of fried and fatty foods and increase intake of lean protein.  Patient also increase physical activity as tolerated.  We will recheck fasting lipid panel in 1 year.  5. Body mass index 28.0-28.9, adult Encourage patient to limit calorie intake to 2000 cal/day or less.  He should consume a low cholesterol, low-fat diet.  Patient should incorporate exercise into his daily routine.   6. Vitamin D insufficiency A prescription for Drisdol 50,000 IU has been sent to his pharmacy.  He should take this weekly for next 6 months.  Vitamin D level in 1 year.  Problem List Items Addressed This Visit       Nervous and Auditory   Lumbar disc disease with radiculopathy - Primary   Relevant Medications   cyclobenzaprine (FLEXERIL) 10 MG tablet   FLUoxetine (PROZAC) 20 MG tablet     Other   GAD (generalized anxiety disorder) (Chronic)   Relevant Medications   FLUoxetine (PROZAC) 20 MG tablet   Panic attack as reaction to stress (Chronic)   Relevant Medications   FLUoxetine (PROZAC) 20 MG tablet   Vitamin D insufficiency (Chronic)   Body mass index 28.0-28.9, adult   LDL-c greater than or equal to 100 mg/dl    Meds ordered this encounter  Medications   FLUoxetine (PROZAC) 20 MG tablet    Sig: Take 1 tablet (20 mg total) by mouth daily.    Dispense:  30 tablet    Refill:  1    Please note increased dose    Order Specific Question:   Supervising Provider    Answer:   Beatrice Lecher D [[id number]]    Follow-up: Return in about 5 weeks (around 01/07/2022) for readiness to return to work - see below.    Ronnell Freshwater, NP  This note was dictated using Systems analyst. Rapid proofreading was performed to expedite the delivery of  the information. Despite proofreading, phonetic errors will occur which are common with this voice recognition software. Please take this into consideration. If there are any concerns, please contact our office.

## 2021-12-05 ENCOUNTER — Telehealth: Payer: Self-pay | Admitting: Orthopaedic Surgery

## 2021-12-05 NOTE — Telephone Encounter (Signed)
11/05/21 ov note faxed to [employer] and advised patient has not been taken oow.

## 2022-01-02 ENCOUNTER — Telehealth: Payer: Self-pay | Admitting: Nurse Practitioner

## 2022-01-02 NOTE — Telephone Encounter (Signed)
Patient left vm asking about his [employer] short term disability papers. Today is the last day that they can be turned in and wants to know if they have been completed. Please advise. [phone]

## 2022-01-02 NOTE — Telephone Encounter (Signed)
Patient has been previously contacted and informed FMLA paperwork is complete and ready for pick up.   Attempted to contact patient today, no answer. Unable to leave voicemail.

## 2022-01-06 ENCOUNTER — Ambulatory Visit: Payer: BC Managed Care – PPO | Admitting: Orthopaedic Surgery

## 2022-01-08 ENCOUNTER — Other Ambulatory Visit: Payer: Self-pay

## 2022-01-08 ENCOUNTER — Ambulatory Visit (INDEPENDENT_AMBULATORY_CARE_PROVIDER_SITE_OTHER): Payer: BC Managed Care – PPO | Admitting: Nurse Practitioner

## 2022-01-08 ENCOUNTER — Encounter: Payer: Self-pay | Admitting: Nurse Practitioner

## 2022-01-08 VITALS — BP 121/83 | HR 96 | Temp 98.2°F | Ht 74.0 in | Wt 225.6 lb

## 2022-01-08 DIAGNOSIS — F410 Panic disorder [episodic paroxysmal anxiety] without agoraphobia: Secondary | ICD-10-CM

## 2022-01-08 DIAGNOSIS — F411 Generalized anxiety disorder: Secondary | ICD-10-CM

## 2022-01-08 DIAGNOSIS — M5116 Intervertebral disc disorders with radiculopathy, lumbar region: Secondary | ICD-10-CM

## 2022-01-08 DIAGNOSIS — F430 Acute stress reaction: Secondary | ICD-10-CM

## 2022-01-08 MED ORDER — BUSPIRONE HCL 10 MG PO TABS: 5.0000 mg | ORAL_TABLET | Freq: Three times a day (TID) | ORAL | 0 refills | Status: DC

## 2022-01-08 MED ORDER — FLUOXETINE HCL 20 MG PO TABS: 20.0000 mg | ORAL_TABLET | Freq: Every day | ORAL | 1 refills | Status: DC

## 2022-01-08 MED ORDER — GABAPENTIN 100 MG PO CAPS: ORAL_CAPSULE | ORAL | 2 refills | Status: DC

## 2022-01-08 NOTE — Progress Notes (Signed)
Established patient visit   Patient: Samuel Vasquez   DOB: [birth year]   41 y.o. Male  MRN: [id number] Visit Date: 01/08/2022  Today's healthcare provider: Ronnell Freshwater, NP   Chief Complaint  Patient presents with   Follow-up   Subjective    The patient continues to have left hip pain which radiates into the left leg and foot. He has tingling and pins and needles sensation in the left leg and foot. He has weakness in the left foot. Mri has been done. It did show: a small right paracentral disc protrusion with mass effect on the right intraspinal S1 nerve root. The patient did see orthopedics provider. Was started on low dose gabapentin. He is only able totake 1 capsule of this at night. Makes him sleepy so he doesn't take it during the day. Hard to tell if this is really helping. Follow up instructions were to follow up in 2 months if symptoms did not improve. Epidural injections were mentioned to be done, possibly, at that two month mark. This pain has been going on since September. Conservative measures with NSAIDs and steroids have not helped. He has been out of work for most of this time.  The patient has been referred to a new orthopedic provider at his last visit, as he was not happy with the care he received. Today, the patient states that he would like to see a provider in [place] or [place] which are [place] closer to his home.   Back Pain This is a chronic problem. The current episode started more than 1 month ago. The problem occurs constantly. The problem has been gradually worsening since onset. The pain is present in the lumbar spine. The quality of the pain is described as burning, shooting and stabbing. The pain radiates to the left thigh, left foot and left knee. The pain is at a severity of 7/10. The symptoms are aggravated by position, standing, sitting and stress. Stiffness is present All day. Associated symptoms include leg pain, numbness, paresthesias and  weakness. Pertinent negatives include no chest pain, dysuria, fever or headaches. He has tried analgesics, home exercises, muscle relaxant, ice and NSAIDs for the symptoms. The treatment provided no relief.     Medications: Outpatient Medications Prior to Visit  Medication Sig   Aspirin-Salicylamide-Caffeine (ARTHRITIS STRENGTH BC POWDER PO) Take by mouth as needed.   ciprofloxacin (CIPRO) 500 MG tablet Take 500 mg by mouth 2 (two) times daily.   cyclobenzaprine (FLEXERIL) 10 MG tablet Take 1 tablet by mouth 2 (two) times daily as needed.   omeprazole (PRILOSEC) 20 MG capsule Take 1 capsule (20 mg total) by mouth 2 (two) times daily as needed.   Vitamin D, Ergocalciferol, (DRISDOL) 1.25 MG (50000 UNIT) CAPS capsule Take 1 capsule (50,000 Units total) by mouth every 7 (seven) days.   [DISCONTINUED] busPIRone (BUSPAR) 10 MG tablet Take 0.5-1 tablets (5-10 mg total) by mouth 3 (three) times daily.   [DISCONTINUED] FLUoxetine (PROZAC) 20 MG tablet Take 1 tablet (20 mg total) by mouth daily.   [DISCONTINUED] gabapentin (NEURONTIN) 100 MG capsule Take one at night for 7 days then 2 po q HS   No facility-administered medications prior to visit.    Review of Systems  Constitutional:  Positive for activity change. Negative for chills, fatigue and fever.       Patient with decreased activity due to pain and weakness in left leg.  HENT:  Negative for congestion, postnasal drip, rhinorrhea, sinus  pressure, sinus pain, sneezing and sore throat.   Eyes: Negative.   Respiratory:  Negative for cough, shortness of breath and wheezing.   Cardiovascular:  Negative for chest pain and palpitations.  Gastrointestinal:  Negative for constipation, diarrhea, nausea and vomiting.  Endocrine: Negative for cold intolerance, heat intolerance, polydipsia and polyuria.  Genitourinary:  Negative for dysuria, frequency and urgency.  Musculoskeletal:  Positive for arthralgias, back pain, gait problem and myalgias.        Patient reports diminished ability to stand for extended periods of time.  Walking is painful, especially with weightbearing on left foot and leg.  States that pain started from left hip all the way down into the left big toe.  Feels like electric shock.  Causes weakness in the leg.  Has had little improvement in symptoms    Skin:  Negative for rash.  Allergic/Immunologic: Negative for environmental allergies.  Neurological:  Positive for weakness, numbness and paresthesias. Negative for dizziness and headaches.  Psychiatric/Behavioral:  Negative for dysphoric mood. The patient is not nervous/anxious.      Objective  Today's Vitals   01/08/22 1001  BP: 121/83  Pulse: 96  Temp: 98.2 F (36.8 C)  SpO2: 99%  Weight: 225 lb 9.6 oz (102.3 kg)  Height: 6\' 2"  (1.88 m)   Body mass index is 28.97 kg/m.    Physical Exam Vitals and nursing note reviewed.  Constitutional:      Appearance: Normal appearance. He is well-developed.  HENT:     Head: Normocephalic and atraumatic.  Eyes:     Pupils: Pupils are equal, round, and reactive to light.  Cardiovascular:     Rate and Rhythm: Normal rate and regular rhythm.     Pulses: Normal pulses.     Heart sounds: Normal heart sounds.  Pulmonary:     Effort: Pulmonary effort is normal.     Breath sounds: Normal breath sounds.  Abdominal:     Palpations: Abdomen is soft.  Musculoskeletal:        General: Normal range of motion.     Cervical back: Normal range of motion and neck supple.     Comments: Patient continues to have an increase in pain when sitting up from a seated position.  Grimaces when placing weight on the left foot and leg.  There is decreased strength with flexion and plantar flexion of the left foot. Bending and twisting at the waist also increasing pain, evidenced by grimacing.  No bony abnormalities or deformities are palpated at this time.     Lymphadenopathy:     Cervical: No cervical adenopathy.  Skin:    General: Skin is  warm and dry.     Capillary Refill: Capillary refill takes less than 2 seconds.  Neurological:     General: No focal deficit present.     Mental Status: He is alert and oriented to person, place, and time.  Psychiatric:        Mood and Affect: Mood normal.        Behavior: Behavior normal.        Thought Content: Thought content normal.        Judgment: Judgment normal.      Assessment & Plan     1. Lumbar disc disease with radiculopathy No change in patient's status.  Moderate radiculopathy and weakness in the left leg.  Waiting on second orthopedic opinion.  Referral has been made.  An extension of FMLA made from 12/03/2021 through 01/17/2022. This would allow  him to go back to work on 02/18/2022 pending evaluation from new orthopedic provider.  A work note was given for patient to get to his employer.  Will complete further FMLA paperwork when it is received.  2. GAD (generalized anxiety disorder) Stable. Continue medications as prescribed    Return in about 4 weeks (around 02/05/2022) for back pain - review FMLA .       Ronnell Freshwater, NP  [facility] [phone] (phone) [phone] (fax)  [place]

## 2022-01-13 ENCOUNTER — Telehealth: Payer: Self-pay | Admitting: Nurse Practitioner

## 2022-01-13 NOTE — Telephone Encounter (Signed)
Patient left a vm stating [employer] received the fmla paper work however they are requesting encounter notes from here. Can you please advise?

## 2022-01-13 NOTE — Telephone Encounter (Signed)
Paper work has been faxed

## 2022-01-19 ENCOUNTER — Other Ambulatory Visit: Payer: Self-pay | Admitting: Nurse Practitioner

## 2022-01-19 ENCOUNTER — Telehealth: Payer: Self-pay | Admitting: Nurse Practitioner

## 2022-01-19 DIAGNOSIS — M5116 Intervertebral disc disorders with radiculopathy, lumbar region: Secondary | ICD-10-CM

## 2022-01-19 NOTE — Telephone Encounter (Signed)
Patient called and wants a referral to [facility] in [place] for a second opinion. Their phone number is [phone]. Please advise. [phone]

## 2022-01-19 NOTE — Telephone Encounter (Signed)
I have out a referral to [facility] in [place] for patient to see for second opinion.

## 2022-01-19 NOTE — Telephone Encounter (Signed)
Ok thanks 

## 2022-02-05 ENCOUNTER — Encounter: Payer: Self-pay | Admitting: Nurse Practitioner

## 2022-02-05 ENCOUNTER — Ambulatory Visit (INDEPENDENT_AMBULATORY_CARE_PROVIDER_SITE_OTHER): Payer: BC Managed Care – PPO | Admitting: Nurse Practitioner

## 2022-02-05 ENCOUNTER — Other Ambulatory Visit: Payer: Self-pay

## 2022-02-05 VITALS — BP 117/82 | HR 73 | Temp 98.1°F | Ht 74.0 in | Wt 223.7 lb

## 2022-02-05 DIAGNOSIS — Z6828 Body mass index (BMI) 28.0-28.9, adult: Secondary | ICD-10-CM | POA: Diagnosis not present

## 2022-02-05 DIAGNOSIS — M5116 Intervertebral disc disorders with radiculopathy, lumbar region: Secondary | ICD-10-CM

## 2022-02-05 DIAGNOSIS — R1032 Left lower quadrant pain: Secondary | ICD-10-CM

## 2022-02-05 NOTE — Progress Notes (Signed)
Established patient visit   Patient: Samuel Vasquez   DOB: [birth year]   41 y.o. Male  MRN: [id number] Visit Date: 02/05/2022   Chief Complaint  Patient presents with   Back Pain   Subjective    HPI  The patient presents for follow up of left hip pain along with weakness of left leg. The patient continues to have left hip pain which radiates into the left leg and foot. He has tingling and pins and needles sensation in the left leg and foot. He has weakness in the left foot. He describes the pain as burning and stinging. Mri has been done. It did show: a small right paracentral disc protrusion with mass effect on the right intraspinal S1 nerve root. The patient did see orthopedics provider. Was started on low dose gabapentin. He is only able totake 1 capsule of this at night. Makes him sleepy so he doesn't take it during the day. Hard to tell if this is really helping. Follow up instructions were to follow up in 2 months if symptoms did not improve. Epidural injections were mentioned to be done, possibly, at that two month mark. This pain has been going on since September. Conservative measures with NSAIDs and steroids have not helped. He has been out of work for most of this time.  The patient has been referred to neurosurgery in [place] for second opinion, and further evaluation. Neurosurgeon is saying they have not received information to provide patient initial appointment. Meanwhile, patient continues to be out of work. 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 He has to get up and down off of the fork  lift many times each night. Stepping up and down like that is impossible due to the weakness and pain he is experiencing.  He has not started to have some having pain in the groin and left pelvic area. Does have some burning in the bladder when he urinates sometimes. He has seen urgent care for this. He will have ultrasound of the pelvis and inguinal area tomorrow. He did  complete a round of antibiotics and had no improvement of symptoms. Provider he saw believes this may be related to the spinal problem as well.   Medications: Outpatient Medications Prior to Visit  Medication Sig   Aspirin-Salicylamide-Caffeine (ARTHRITIS STRENGTH BC POWDER PO) Take by mouth as needed.   busPIRone (BUSPAR) 10 MG tablet Take 0.5-1 tablets (5-10 mg total) by mouth 3 (three) times daily.   ciprofloxacin (CIPRO) 500 MG tablet Take 500 mg by mouth 2 (two) times daily.   cyclobenzaprine (FLEXERIL) 10 MG tablet Take 1 tablet by mouth 2 (two) times daily as needed.   FLUoxetine (PROZAC) 20 MG tablet Take 1 tablet (20 mg total) by mouth daily.   gabapentin (NEURONTIN) 100 MG capsule Take one at night for 7 days then 2 po q HS   omeprazole (PRILOSEC) 20 MG capsule Take 1 capsule (20 mg total) by mouth 2 (two) times daily as needed.   Vitamin D, Ergocalciferol, (DRISDOL) 1.25 MG (50000 UNIT) CAPS capsule Take 1 capsule (50,000 Units total) by mouth every 7 (seven) days.   No facility-administered medications prior to visit.    Review of Systems  Constitutional:  Positive for activity change. Negative for chills, fatigue and fever.       Patient with decreased activity due to pain and weakness in left leg.  HENT:  Negative for congestion, postnasal drip, rhinorrhea, sinus pressure, sinus pain, sneezing and  sore throat.   Eyes: Negative.   Respiratory:  Negative for cough, shortness of breath and wheezing.   Cardiovascular:  Negative for chest pain and palpitations.  Gastrointestinal:  Negative for constipation, diarrhea, nausea and vomiting.  Endocrine: Negative for cold intolerance, heat intolerance, polydipsia and polyuria.  Genitourinary:  Negative for dysuria, frequency and urgency.  Musculoskeletal:  Positive for arthralgias, back pain, gait problem and myalgias.       Patient reports diminished ability to stand for extended periods of time.  Walking is painful, especially with  weightbearing on left foot and leg.  States that pain started from left hip all the way down into the left big toe.  Feels like electric shock.  Causes weakness in the leg.  Has had little improvement in symptoms    Skin:  Negative for rash.  Allergic/Immunologic: Negative for environmental allergies.  Neurological:  Positive for weakness and numbness. Negative for dizziness and headaches.  Psychiatric/Behavioral:  Negative for dysphoric mood. The patient is not nervous/anxious.      Objective     Today's Vitals   02/05/22 1002  BP: 117/82  Pulse: 73  Temp: 98.1 F (36.7 C)  SpO2: 99%  Weight: 223 lb 11.2 oz (101.5 kg)   Body mass index is 28.72 kg/m.   BP Readings from Last 3 Encounters:  02/05/22 117/82  01/08/22 121/83  12/03/21 137/84    Wt Readings from Last 3 Encounters:  02/05/22 223 lb 11.2 oz (101.5 kg)  01/08/22 225 lb 9.6 oz (102.3 kg)  12/03/21 222 lb 1.6 oz (100.7 kg)    Physical Exam Vitals and nursing note reviewed.  Constitutional:      Appearance: Normal appearance. He is well-developed.  HENT:     Head: Normocephalic and atraumatic.  Eyes:     Pupils: Pupils are equal, round, and reactive to light.  Cardiovascular:     Rate and Rhythm: Normal rate and regular rhythm.     Pulses: Normal pulses.     Heart sounds: Normal heart sounds.  Pulmonary:     Effort: Pulmonary effort is normal.     Breath sounds: Normal breath sounds.  Abdominal:     Palpations: Abdomen is soft.  Musculoskeletal:        General: Normal range of motion.     Cervical back: Normal range of motion and neck supple.     Comments: Patient continues to have pain in the left leg, left groin, and left lower bac when sitting up from a seated position.  Grimaces when placing weight on the left foot and leg.  There is decreased strength with flexion and plantar flexion of the left foot. Bending and twisting at the waist also increasing pain, evidenced by grimacing.  No bony abnormalities  or deformities are palpated at this time.     Lymphadenopathy:     Cervical: No cervical adenopathy.  Skin:    General: Skin is warm and dry.     Capillary Refill: Capillary refill takes less than 2 seconds.  Neurological:     General: No focal deficit present.     Mental Status: He is alert and oriented to person, place, and time.  Psychiatric:        Mood and Affect: Mood normal.        Behavior: Behavior normal.        Thought Content: Thought content normal.        Judgment: Judgment normal.      Assessment & Plan  1. Lumbar disc disease with radiculopathy Referral placed to [facility] neurosurgery in [place]. Will continue patient's FMLA, short-term disability paperwork for additional 6 weeks so that he maybe evaluated by neurosurgery. Will extend his leave of absence through 03/20/2022 with the possibility of returning to work on 03/23/2022. Will complete disability/FMLA paperwork to cover these dates for patient.  - Ambulatory referral to Neurosurgery  2. Left groin pain Patient to have ultrasound of left pelvis for further evaluation.   3. Body mass index 28.0-28.9, adult Encourage patient to limit calorie intake to 2000 cal/day or less.  He should consume a low cholesterol, low-fat diet.       Return in about 6 weeks (around 03/19/2022) for return to work - .         Carlean Jews, NP  [facility] [phone] (phone) [phone] (fax)  [facility]

## 2022-02-14 DIAGNOSIS — R1032 Left lower quadrant pain: Secondary | ICD-10-CM | POA: Insufficient documentation

## 2022-02-17 ENCOUNTER — Telehealth: Payer: Self-pay | Admitting: Nurse Practitioner

## 2022-02-17 NOTE — Telephone Encounter (Signed)
Patient requesting [place] assistant give him a call. AS, CMA

## 2022-02-17 NOTE — Telephone Encounter (Signed)
Called pt he provided the fax # for his neurologist

## 2022-03-19 ENCOUNTER — Ambulatory Visit (INDEPENDENT_AMBULATORY_CARE_PROVIDER_SITE_OTHER): Payer: BC Managed Care – PPO | Admitting: Nurse Practitioner

## 2022-03-19 ENCOUNTER — Encounter: Payer: Self-pay | Admitting: Nurse Practitioner

## 2022-03-19 VITALS — BP 132/93 | HR 78 | Temp 98.0°F | Ht 74.02 in | Wt 231.6 lb

## 2022-03-19 DIAGNOSIS — R12 Heartburn: Secondary | ICD-10-CM

## 2022-03-19 DIAGNOSIS — F410 Panic disorder [episodic paroxysmal anxiety] without agoraphobia: Secondary | ICD-10-CM

## 2022-03-19 DIAGNOSIS — M5116 Intervertebral disc disorders with radiculopathy, lumbar region: Secondary | ICD-10-CM

## 2022-03-19 DIAGNOSIS — Z6829 Body mass index (BMI) 29.0-29.9, adult: Secondary | ICD-10-CM

## 2022-03-19 DIAGNOSIS — F411 Generalized anxiety disorder: Secondary | ICD-10-CM

## 2022-03-19 DIAGNOSIS — R1032 Left lower quadrant pain: Secondary | ICD-10-CM

## 2022-03-19 DIAGNOSIS — E559 Vitamin D deficiency, unspecified: Secondary | ICD-10-CM

## 2022-03-19 DIAGNOSIS — F430 Acute stress reaction: Secondary | ICD-10-CM

## 2022-03-19 MED ORDER — OMEPRAZOLE 20 MG PO CPDR: 20.0000 mg | DELAYED_RELEASE_CAPSULE | Freq: Two times a day (BID) | ORAL | 3 refills | Status: DC | PRN

## 2022-03-19 MED ORDER — VITAMIN D (ERGOCALCIFEROL) 1.25 MG (50000 UNIT) PO CAPS: 50000.0000 [IU] | ORAL_CAPSULE | ORAL | 1 refills | Status: DC

## 2022-03-19 MED ORDER — FLUOXETINE HCL 20 MG PO TABS: 20.0000 mg | ORAL_TABLET | Freq: Every day | ORAL | 3 refills | Status: DC

## 2022-03-19 MED ORDER — GABAPENTIN 100 MG PO CAPS: ORAL_CAPSULE | ORAL | 2 refills | Status: DC

## 2022-03-19 MED ORDER — BUSPIRONE HCL 10 MG PO TABS: 5.0000 mg | ORAL_TABLET | Freq: Three times a day (TID) | ORAL | 3 refills | Status: DC

## 2022-03-19 NOTE — Progress Notes (Signed)
Established patient visit ? ? ?Patient: Samuel Vasquez   DOB: 02/20/1981   41 y.o. Male  MRN: [id number] ?Visit Date: 03/19/2022 ? ? ?Chief Complaint  ?Patient presents with  ? Follow-up  ? ?Subjective  ?  ?HPI  ?The patient continues to have left hip pain along with weakness of left leg. The patient continues to have left hip pain which radiates into the left leg and foot. He has tingling and pins and needles sensation in the left leg and foot. He has weakness in the left foot. He describes the pain as burning and stinging. Mri was done 10/28/2021. It did show: a small right paracentral disc protrusion with mass effect on the right intraspinal S1 nerve root. ?The patient did see orthopedics provider on 11/07/2021. Was started on low dose gabapentin. He is only able totake 1 capsule of this at night. Makes him sleepy so he doesn't take it during the day. Hard to tell if this is really helping. Follow up instructions were to follow up in 2 months if symptoms did not improve. Epidural injections were mentioned to be done, possibly, at that two month mark. Patient has not been back to see provider as of yet. Was trying to consult with neurosurgery. Despite multiple referrals to  spinal interventionalist and neurosurgery, a referral was unable to be obtained. Today, patient states he would like to go back to Dr. Ophelia CharterYates, orthopedic provider who saw him initially, for further evaluation. The patient reports little to no improvement in his condition. Still have weakness of the left leg, making it difficult for him to stand or walk for longer than ten minutes at a time.  ?Patient states that he does need refills for routine medications.  ? ? ?Medications: ?Outpatient Medications Prior to Visit  ?Medication Sig  ? Aspirin-Salicylamide-Caffeine (ARTHRITIS STRENGTH BC POWDER PO) Take by mouth as needed.  ? cyclobenzaprine (FLEXERIL) 10 MG tablet Take 1 tablet by mouth 2 (two) times daily as needed.  ? [DISCONTINUED] busPIRone  (BUSPAR) 10 MG tablet Take 0.5-1 tablets (5-10 mg total) by mouth 3 (three) times daily.  ? [DISCONTINUED] ciprofloxacin (CIPRO) 500 MG tablet Take 500 mg by mouth 2 (two) times daily.  ? [DISCONTINUED] FLUoxetine (PROZAC) 20 MG tablet Take 1 tablet (20 mg total) by mouth daily.  ? [DISCONTINUED] gabapentin (NEURONTIN) 100 MG capsule Take one at night for 7 days then 2 po q HS  ? [DISCONTINUED] omeprazole (PRILOSEC) 20 MG capsule Take 1 capsule (20 mg total) by mouth 2 (two) times daily as needed.  ? [DISCONTINUED] Vitamin D, Ergocalciferol, (DRISDOL) 1.25 MG (50000 UNIT) CAPS capsule Take 1 capsule (50,000 Units total) by mouth every 7 (seven) days.  ? ?No facility-administered medications prior to visit.  ? ? ?Review of Systems  ?Constitutional:  Positive for activity change. Negative for chills, fatigue and fever.  ?     Patient with decreased activity due to pain and weakness in left leg.  ?HENT:  Negative for congestion, postnasal drip, rhinorrhea, sinus pressure, sinus pain, sneezing and sore throat.   ?Eyes: Negative.   ?Respiratory:  Negative for cough, shortness of breath and wheezing.   ?Cardiovascular:  Negative for chest pain and palpitations.  ?Gastrointestinal:  Negative for constipation, diarrhea, nausea and vomiting.  ?Endocrine: Negative for cold intolerance, heat intolerance, polydipsia and polyuria.  ?Genitourinary:  Negative for dysuria, frequency and urgency.  ?Musculoskeletal:  Positive for arthralgias, back pain, gait problem and myalgias.  ?     Patient reports diminished ability  to stand for extended periods of time.  Walking is painful, especially with weightbearing on left foot and leg.  States that pain started from left hip all the way down into the left big toe.  Feels like electric shock.  Causes weakness in the leg.  Has had little improvement in symptoms    ?Skin:  Negative for rash.  ?Allergic/Immunologic: Negative for environmental allergies.  ?Neurological:  Positive for weakness  and numbness. Negative for dizziness and headaches.  ?Psychiatric/Behavioral:  Positive for dysphoric mood. The patient is nervous/anxious.   ?     Doing better on current medications.   ? ? ? Objective  ?  ? ?Today's Vitals  ? 03/19/22 1113  ?BP: (!) 132/93  ?Pulse: 78  ?Temp: 98 ?F (36.7 ?C)  ?SpO2: 98%  ?Weight: 231 lb 9.6 oz (105.1 kg)  ?Height: 6' 2.02" (1.88 m)  ? ?Body mass index is 29.72 kg/m?.  ? ?BP Readings from Last 3 Encounters:  ?03/19/22 (!) 132/93  ?02/05/22 117/82  ?01/08/22 121/83  ?  ?Wt Readings from Last 3 Encounters:  ?03/19/22 231 lb 9.6 oz (105.1 kg)  ?02/05/22 223 lb 11.2 oz (101.5 kg)  ?01/08/22 225 lb 9.6 oz (102.3 kg)  ?  ?Physical Exam ?Vitals and nursing note reviewed.  ?Constitutional:   ?   Appearance: Normal appearance. He is well-developed.  ?HENT:  ?   Head: Normocephalic and atraumatic.  ?Eyes:  ?   Pupils: Pupils are equal, round, and reactive to light.  ?Cardiovascular:  ?   Rate and Rhythm: Normal rate and regular rhythm.  ?   Pulses: Normal pulses.  ?   Heart sounds: Normal heart sounds.  ?Pulmonary:  ?   Effort: Pulmonary effort is normal.  ?   Breath sounds: Normal breath sounds.  ?Abdominal:  ?   Palpations: Abdomen is soft.  ?Musculoskeletal:     ?   General: Normal range of motion.  ?   Cervical back: Normal range of motion and neck supple.  ?   Comments: Patient continues to have pain in the left leg, left groin, and left lower bac when sitting up from a seated position.  Grimaces when placing weight on the left foot and leg.  There is decreased strength with flexion and plantar flexion of the left foot. Bending and twisting at the waist also increasing pain, evidenced by grimacing.  No bony abnormalities or deformities are palpated at this time.   ?  ?  ?Lymphadenopathy:  ?   Cervical: No cervical adenopathy.  ?Skin: ?   General: Skin is warm and dry.  ?   Capillary Refill: Capillary refill takes less than 2 seconds.  ?Neurological:  ?   General: No focal deficit  present.  ?   Mental Status: He is alert and oriented to person, place, and time.  ?Psychiatric:     ?   Mood and Affect: Mood normal.     ?   Behavior: Behavior normal.     ?   Thought Content: Thought content normal.     ?   Judgment: Judgment normal.  ?  ? ? Assessment & Plan  ?  ?1. Lumbar disc disease with radiculopathy ?Patient may increase gabapentin 100mg  to two capsules at bedtime. Refer him back to Dr. to discuss further treatment to manage this condition. Trial epidural steroids was mentioned previously. A new work note was given to the patient stating that he should continue to be out of work due  to lumbar disc protrusion with radiculopathy to the left leg causing weakness and pain.  .An extension of FMLA made through 04/24/2022, allowing him to go back to work on 04/27/2022 pending additional evaluation and treatment from orthopedic provider. FMLA paperwork and/or disability paperwork will be completed and returned to patient and his emplyer when available.  ?- Ambulatory referral to Orthopedic Surgery ?- gabapentin (NEURONTIN) 100 MG capsule; Take one at night for 7 days then 2 po q HS  Dispense: 60 capsule; Refill: 2 ? ?2. Left groin pain ?This is likely due to lumbar disc protrusion. A new referral back to orthopedic provider made today to further evaluate and treat this condition.  ?- Ambulatory referral to Orthopedic Surgery ? ?3. GAD (generalized anxiety disorder) ?Patient should continue prozac 20mg  daily. Increase buspirone to 10mg  taking as needed up to three times daily if needed for acute anxiety.  ?- busPIRone (BUSPAR) 10 MG tablet; Take 0.5-1 tablets (5-10 mg total) by mouth 3 (three) times daily.  Dispense: 90 tablet; Refill: 3 ?- FLUoxetine (PROZAC) 20 MG tablet; Take 1 tablet (20 mg total) by mouth daily.  Dispense: 30 tablet; Refill: 3 ? ?4. Panic attack as reaction to stress ?Patient should continue prozac 20mg  daily. Increase buspirone to 10mg  taking as needed up to three times  daily if needed for acute anxiety. ?- FLUoxetine (PROZAC) 20 MG tablet; Take 1 tablet (20 mg total) by mouth daily.  Dispense: 30 tablet; Refill: 3 ? ?5. Heartburn ?Continue omeprazole 20mg  up to twice daily as need

## 2022-03-31 ENCOUNTER — Telehealth: Payer: Self-pay | Admitting: Radiology

## 2022-03-31 DIAGNOSIS — M5116 Intervertebral disc disorders with radiculopathy, lumbar region: Secondary | ICD-10-CM

## 2022-03-31 NOTE — Telephone Encounter (Signed)
I called patient regarding My Chart appointment request with Dr. Ophelia Charter to schedule ESI. Per Dr. Ophelia Charter, ok to schedule ESI and then have patient return post ESI to see him. Patient aware he will receive call from Dr. Hondah Blas scheduler after insurance authorization is obtained. ?

## 2022-04-22 ENCOUNTER — Encounter: Payer: Self-pay | Admitting: Physical Medicine and Rehabilitation

## 2022-04-22 ENCOUNTER — Ambulatory Visit (INDEPENDENT_AMBULATORY_CARE_PROVIDER_SITE_OTHER): Payer: BC Managed Care – PPO | Admitting: Physical Medicine and Rehabilitation

## 2022-04-22 VITALS — BP 137/81 | HR 101

## 2022-04-22 DIAGNOSIS — M4726 Other spondylosis with radiculopathy, lumbar region: Secondary | ICD-10-CM | POA: Diagnosis not present

## 2022-04-22 DIAGNOSIS — M5116 Intervertebral disc disorders with radiculopathy, lumbar region: Secondary | ICD-10-CM

## 2022-04-22 DIAGNOSIS — M5416 Radiculopathy, lumbar region: Secondary | ICD-10-CM | POA: Diagnosis not present

## 2022-04-22 MED ORDER — DIAZEPAM 5 MG PO TABS: ORAL_TABLET | ORAL | 0 refills | Status: AC

## 2022-04-22 NOTE — Progress Notes (Signed)
Pt state lower back pain that travels down his left leg to his foot. Pt state walking and laying down makes the pain worse. Pt state he take [place] meds to help ease his pain. ? ?Numeric Pain Rating Scale and Functional Assessment ?Average Pain 9 ?Pain Right Now 6 ?My pain is constant, sharp, burning, dull, stabbing, tingling, and aching ?Pain is worse with: walking, some activites, and laying down ?Pain improves with: medication ? ? ?In the last MONTH (on 0-10 scale) has pain interfered with the following? ? ?1. General activity like being  able to carry out your everyday physical activities such as walking, climbing stairs, carrying groceries, or moving a chair?  ?Rating(5) ? ?2. Relation with others like being able to carry out your usual social activities and roles such as  activities at home, at work and in your community. ?Rating(6) ? ?3. Enjoyment of life such that you have  been bothered by emotional problems such as feeling anxious, depressed or irritable?  ?Rating(7) ? ?

## 2022-04-22 NOTE — Progress Notes (Signed)
? ?Samuel Skainsracy Cowden - 41 y.o. male MRN [id number]  Date of birth: 02/06/1981 ? ?Office Visit Note: ?Visit Date: 04/22/2022 ?PCP: Carlean JewsBoscia, Heather E, NP ?Referred by: Carlean JewsBoscia, Heather E, NP ? ?Subjective: ?Chief Complaint  ?Patient presents with  ? Lower Back - Pain  ? Left Leg - Pain  ? Left Foot - Pain  ? ?HPI: Samuel Vasquez is a 41 y.o. male who comes in today at the request of Dr. Annell GreeningMark Yates for evaluation of bilateral lower back pain radiating to left buttock and down posterolateral leg to foot. Patient reports pain has been ongoing for seven months, his pain is exacerbated by movement, activity and walking. He describes pain as a constant sore, aching and shooting sensation, currently rates as 8 out of 10. Patient reports some relief of pain with home exercise regimen, rest and use of over the counter medications as needed. Patient does take Gabapentin. Patients lumbar MRI from 2022 exhibits small right paracentral disc protrusion with mass effect on right S1 nerve root at L5-S1. No high grade spinal stenosis noted. Patient was previously seen by Dr. Annell GreeningMark Yates in our office, per his notes no recommendations for surgery at this time. No history of lumbar surgery/epidural steroid injections. Patient states he has been out of work for seven months due to severe back issues. Patient denies focal weakness, numbness and tingling. Patient denies recent trauma or falls.  ? ?Review of Systems  ?Musculoskeletal:  Positive for back pain.  ?Neurological:  Negative for tingling, sensory change, focal weakness and weakness.  ?All other systems reviewed and are negative. Otherwise per HPI. ? ?Assessment & Plan: ?Visit Diagnoses:  ?  ICD-10-CM   ?1. Lumbar radiculopathy  M54.16 Ambulatory referral to Physical Medicine Rehab  ?  ?2. Intervertebral disc disorders with radiculopathy, lumbar region  M51.16 Ambulatory referral to Physical Medicine Rehab  ?  ?3. Other spondylosis with radiculopathy, lumbar region  M47.26 Ambulatory  referral to Physical Medicine Rehab  ?  ?   ?Plan: Findings:  ?Chronic, worsening and severe bilateral lower back pain radiating to left buttock and down posterolateral leg to foot. Patient continues to have severe pain despite good conservative therapies such as home exercise regimen, rest and use of over the counter medications as needed. Patients clinical presentation and exam are consistent with L5/S1 nerve pattern. He does have paracentral disc protrusion more on the right at the level of L5-S1. We believe the next step is to perform a diagnostic and hopefully therapeutic left L5-S1 interlaminar epidural steroid injection under fluoroscopic guidance. Lumbar epidural steroid injection procedure discussed with patient in detail, he has no questions at this time. Patient did voice concerns about anxiety related to procedure, I did place order for pre-procedure Valium today. We did help patient set up 2 week follow up with Dr. Ophelia CharterYates post injection for re-evaluation. No red flag symptoms noted upon exam today.   ? ?Meds & Orders:  ?Meds ordered this encounter  ?Medications  ? diazepam (VALIUM) 5 MG tablet  ?  Sig: Take one tablet by mouth with food one hour prior to procedure. May repeat 30 minutes prior if needed.  ?  Dispense:  2 tablet  ?  Refill:  0  ?  Order Specific Question:   Supervising Provider  ?  AnswerTyrell Antonio:   NEWTON, FREDERIC [[id number]]  ?  ?Orders Placed This Encounter  ?Procedures  ? Ambulatory referral to Physical Medicine Rehab  ?  ?Follow-up: Return for Left L5-S1 interlaminar epidural steroid injection.  ? ?  Procedures: ?No procedures performed  ?   ? ?Clinical History: ?MRI LUMBAR SPINE WITHOUT CONTRAST ?  ?TECHNIQUE: ?Multiplanar, multisequence MR imaging of the lumbar spine was ?performed. No intravenous contrast was administered. ?  ?COMPARISON:  X-ray lumbar 10/03/2021. ?  ?FINDINGS: ?Segmentation:  Standard. ?  ?Alignment:  Physiologic. ?  ?Vertebrae: No acute fracture, evidence of discitis, or  aggressive ?bone lesion. Tiny Schmorl's node involving the T12-L1, L2, L3 and L4 ?vertebral bodies. ?  ?Conus medullaris and cauda equina: Conus extends to the L1 level. ?Conus and cauda equina appear normal. ?  ?Paraspinal and other soft tissues: No acute paraspinal abnormality. ?  ?Disc levels: ?  ?Disc spaces: Disc spaces are maintained. ?  ?T12-L1: No significant disc bulge. No neural foraminal stenosis. No ?central canal stenosis. ?  ?L1-L2: No significant disc bulge. No neural foraminal stenosis. No ?central canal stenosis. ?  ?L2-L3: No significant disc bulge. No neural foraminal stenosis. No ?central canal stenosis. ?  ?L3-L4: No significant disc bulge. No neural foraminal stenosis. No ?central canal stenosis. ?  ?L4-L5: No significant disc bulge. No neural foraminal stenosis. No ?central canal stenosis. ?  ?L5-S1: Small right paracentral disc protrusion with mass effect on ?the right intraspinal S1 nerve root. No foraminal or central canal ?stenosis. ?  ?IMPRESSION: ?1. At L5-S1 there is a small right paracentral disc protrusion with ?mass effect on the right intraspinal S1 nerve root. ?  ?  ?Electronically Signed ?  By: Elige Ko M.D. ?  On: 10/28/2021 09:54  ? ?He reports that he has been smoking cigarettes. He has been smoking an average of 1 pack per day. He has never used smokeless tobacco.  ?Recent Labs  ?  11/17/21 ?0854  ?HGBA1C 5.1  ? ? ?Objective:  VS:  HT:    WT:   BMI:     BP:137/81  HR: ?(!) 101bpm  TEMP: ( )  RESP:  ?Physical Exam ?Vitals and nursing note reviewed.  ?HENT:  ?   Head: Normocephalic and atraumatic.  ?   Right Ear: External ear normal.  ?   Left Ear: External ear normal.  ?   Nose: Nose normal.  ?   Mouth/Throat:  ?   Mouth: Mucous membranes are moist.  ?Eyes:  ?   Extraocular Movements: Extraocular movements intact.  ?Cardiovascular:  ?   Pulses: Normal pulses.  ?Pulmonary:  ?   Effort: Pulmonary effort is normal.  ?Abdominal:  ?   General: Abdomen is flat. There is no  distension.  ?Musculoskeletal:     ?   General: Tenderness present.  ?   Cervical back: Normal range of motion.  ?   Comments: Pt rises from seated position to standing without difficulty. Good lumbar range of motion. Strong distal strength without clonus, no pain upon palpation of greater trochanters. Dysesthesias noted to left L5/S1 dermatomes. Sensation intact bilaterally. Walks independently, gait steady.   ?Skin: ?   General: Skin is warm and dry.  ?   Capillary Refill: Capillary refill takes less than 2 seconds.  ?Neurological:  ?   General: No focal deficit present.  ?   Mental Status: He is alert and oriented to person, place, and time.  ?Psychiatric:     ?   Mood and Affect: Mood normal.     ?   Behavior: Behavior normal.  ?  ?Ortho Exam ? ?Imaging: ?No results found. ? ?Past Medical/Family/Surgical/Social History: ?Medications & Allergies reviewed per EMR, new medications updated. ?Patient Active Problem  List  ? Diagnosis Date Noted  ? Left groin pain 02/14/2022  ? LDL-c greater than or equal to 100 mg/dl 24/40/1027  ? Heartburn 11/02/2021  ? Body mass index 29.0-29.9, adult 11/02/2021  ? Lumbar disc disease with radiculopathy 10/05/2021  ? Hypochondriasis 08/19/2019  ? Psychosomatic disorder 08/19/2019  ? Alopecia, male pattern 12/25/2016  ? Sleep difficulties 12/25/2016  ? HSV-1 infection 09/29/2016  ? Family history of hypertension 06/26/2016  ? Family history of mixed hyperlipidemia 06/26/2016  ? GAD (generalized anxiety disorder) 06/01/2016  ? Panic attack as reaction to stress 06/01/2016  ? Vitamin D insufficiency 06/01/2016  ? Overweight (BMI 25.0-29.9) 06/01/2016  ? Chronic cervical pain 09/05/2013  ? ?Past Medical History:  ?Diagnosis Date  ? Chronic cervical pain 09/05/2013  ? Chronic neck pain   ? Family history of hypertension 06/26/2016  ? Family history of mixed hyperlipidemia 06/26/2016  ? GAD (generalized anxiety disorder) 06/01/2016  ? Overweight (BMI 25.0-29.9) 06/01/2016  ? Panic attack as  reaction to stress 06/01/2016  ? Vitamin D insufficiency 06/01/2016  ? ?Family History  ?Problem Relation Age of Onset  ? Hyperlipidemia Mother   ? Hypertension Mother   ? Hypertension Brother   ? ?Past Surgical History

## 2022-04-23 ENCOUNTER — Encounter: Payer: Self-pay | Admitting: Nurse Practitioner

## 2022-04-23 ENCOUNTER — Ambulatory Visit (INDEPENDENT_AMBULATORY_CARE_PROVIDER_SITE_OTHER): Payer: BC Managed Care – PPO | Admitting: Nurse Practitioner

## 2022-04-23 VITALS — BP 119/82 | HR 82 | Temp 98.2°F | Ht 74.02 in | Wt 236.9 lb

## 2022-04-23 DIAGNOSIS — F411 Generalized anxiety disorder: Secondary | ICD-10-CM | POA: Diagnosis not present

## 2022-04-23 DIAGNOSIS — E559 Vitamin D deficiency, unspecified: Secondary | ICD-10-CM | POA: Diagnosis not present

## 2022-04-23 DIAGNOSIS — Z6830 Body mass index (BMI) 30.0-30.9, adult: Secondary | ICD-10-CM | POA: Diagnosis not present

## 2022-04-23 DIAGNOSIS — M5116 Intervertebral disc disorders with radiculopathy, lumbar region: Secondary | ICD-10-CM

## 2022-04-23 MED ORDER — GABAPENTIN 100 MG PO CAPS: ORAL_CAPSULE | ORAL | 1 refills | Status: DC

## 2022-04-23 MED ORDER — VITAMIN D (ERGOCALCIFEROL) 1.25 MG (50000 UNIT) PO CAPS: 50000.0000 [IU] | ORAL_CAPSULE | ORAL | 1 refills | Status: AC

## 2022-04-23 MED ORDER — BUSPIRONE HCL 10 MG PO TABS: 5.0000 mg | ORAL_TABLET | Freq: Three times a day (TID) | ORAL | 1 refills | Status: DC

## 2022-04-23 MED ORDER — FLUOXETINE HCL 20 MG PO TABS: 20.0000 mg | ORAL_TABLET | Freq: Every day | ORAL | 1 refills | Status: DC

## 2022-04-23 NOTE — Progress Notes (Signed)
Established patient visit ? ? ?Patient: Samuel Vasquez   DOB: October 21, 1981   41 y.o. Male  MRN: [id number] ?Visit Date: 04/23/2022 ? ? ?Chief Complaint  ?Patient presents with  ? Follow-up  ? ?Subjective  ?  ?HPI  ?Patient here for follow up visit. The patient continues to have left hip pain which radiates into the left leg and foot. He has tingling and pins and needles sensation in the left leg and foot. He has weakness in the left foot. He describes the pain as burning and stinging. Mri was done 10/28/2021. It did show: a small right paracentral disc protrusion with mass effect on the right intraspinal S1 nerve root. ?The patient did see orthopedics provider on 11/07/2021. Was started on low dose gabapentin. He is only able totake 1 capsule of this at night. Makes him sleepy so he doesn't take it during the day. ?Was seen by orthopedics yesterday and recommendations were made for him to start epidural steroid injections. He will also follow up with Dr. Ophelia Charter, post procedure in 2 weeks.  ?Initial epidural steroid injection is scheduled for 05/27/2022. His follow up with Dr. Ophelia Charter is 06/02/2022.  ?FMLA will need to be continued while he continues to go through therapeutic treatment. ? ? ?Medications: ?Outpatient Medications Prior to Visit  ?Medication Sig  ? Aspirin-Salicylamide-Caffeine (ARTHRITIS STRENGTH BC POWDER PO) Take by mouth as needed.  ? cyclobenzaprine (FLEXERIL) 10 MG tablet Take 1 tablet by mouth 2 (two) times daily as needed.  ? diazepam (VALIUM) 5 MG tablet Take one tablet by mouth with food one hour prior to procedure. May repeat 30 minutes prior if needed.  ? omeprazole (PRILOSEC) 20 MG capsule Take 1 capsule (20 mg total) by mouth 2 (two) times daily as needed.  ? [DISCONTINUED] busPIRone (BUSPAR) 10 MG tablet Take 0.5-1 tablets (5-10 mg total) by mouth 3 (three) times daily.  ? [DISCONTINUED] FLUoxetine (PROZAC) 20 MG tablet Take 1 tablet (20 mg total) by mouth daily.  ? [DISCONTINUED] gabapentin  (NEURONTIN) 100 MG capsule Take one at night for 7 days then 2 po q HS  ? [DISCONTINUED] Vitamin D, Ergocalciferol, (DRISDOL) 1.25 MG (50000 UNIT) CAPS capsule Take 1 capsule (50,000 Units total) by mouth every 7 (seven) days.  ? ?No facility-administered medications prior to visit.  ? ? ?Review of Systems  ?Constitutional:  Positive for activity change. Negative for chills, fatigue and fever.  ?     Patient with decreased activity due to pain and weakness in left leg. Five pound weight gain since most recent visit.   ?HENT:  Negative for congestion, postnasal drip, rhinorrhea, sinus pressure, sinus pain, sneezing and sore throat.   ?Eyes: Negative.   ?Respiratory:  Negative for cough, shortness of breath and wheezing.   ?Cardiovascular:  Negative for chest pain and palpitations.  ?Gastrointestinal:  Negative for constipation, diarrhea, nausea and vomiting.  ?Endocrine: Negative for cold intolerance, heat intolerance, polydipsia and polyuria.  ?Genitourinary:  Negative for dysuria, frequency and urgency.  ?Musculoskeletal:  Positive for arthralgias, back pain, gait problem and myalgias.  ?     Patient reports diminished ability to stand for extended periods of time.  Walking is painful, especially with weightbearing on left foot and leg.  States that pain started from left hip all the way down into the left big toe.  Feels like electric shock.  Causes weakness in the leg.  Has had little improvement in symptoms    ?Skin:  Negative for rash.  ?Allergic/Immunologic: Negative for  environmental allergies.  ?Neurological:  Positive for weakness and numbness. Negative for dizziness and headaches.  ?Psychiatric/Behavioral:  Positive for dysphoric mood. The patient is nervous/anxious.   ?     Doing better on current medications.   ? ? ? ? Objective  ?  ? ?Today's Vitals  ? 04/23/22 1028  ?BP: 119/82  ?Pulse: 82  ?Temp: 98.2 ?F (36.8 ?C)  ?SpO2: 100%  ?Weight: 236 lb 14.4 oz (107.5 kg)  ?Height: 6' 2.02" (1.88 m)  ? ?Body  mass index is 30.4 kg/m?.  ? ?BP Readings from Last 3 Encounters:  ?04/23/22 119/82  ?04/22/22 137/81  ?03/19/22 (!) 132/93  ?  ?Wt Readings from Last 3 Encounters:  ?04/23/22 236 lb 14.4 oz (107.5 kg)  ?03/19/22 231 lb 9.6 oz (105.1 kg)  ?02/05/22 223 lb 11.2 oz (101.5 kg)  ?  ?Physical Exam ?Vitals and nursing note reviewed.  ?Constitutional:   ?   Appearance: Normal appearance. He is well-developed.  ?HENT:  ?   Head: Normocephalic and atraumatic.  ?Eyes:  ?   Pupils: Pupils are equal, round, and reactive to light.  ?Cardiovascular:  ?   Rate and Rhythm: Normal rate and regular rhythm.  ?   Pulses: Normal pulses.  ?   Heart sounds: Normal heart sounds.  ?Pulmonary:  ?   Effort: Pulmonary effort is normal.  ?   Breath sounds: Normal breath sounds.  ?Abdominal:  ?   Palpations: Abdomen is soft.  ?Musculoskeletal:     ?   General: Normal range of motion.  ?   Cervical back: Normal range of motion and neck supple.  ?   Comments: atient continues to have pain in the left leg, left groin, and left lower bac when sitting up from a seated position.  Grimaces when placing weight on the left foot and leg.  There is decreased strength with flexion and plantar flexion of the left foot. Bending and twisting at the waist also increasing pain, evidenced by grimacing.  No bony abnormalities or deformities are palpated at this time.   ?  ?  ?Lymphadenopathy:  ?   Cervical: No cervical adenopathy.  ?Skin: ?   General: Skin is warm and dry.  ?   Capillary Refill: Capillary refill takes less than 2 seconds.  ?Neurological:  ?   General: No focal deficit present.  ?   Mental Status: He is alert and oriented to person, place, and time.  ?Psychiatric:     ?   Mood and Affect: Mood normal.     ?   Behavior: Behavior normal.     ?   Thought Content: Thought content normal.     ?   Judgment: Judgment normal.  ?  ? ? Assessment & Plan  ?  ?1. Lumbar disc disease with radiculopathy ?Reviewed notes from orthopedics provider. Patient to get  first epidural steroid injection on 05/27/2022 and will follow up with ortho provider on 06/02/2022. If effective, the patient may be able to return to work on 06/08/2022. Will see him back after ortho to assess his readiness to return to work and discuss plan for future treatments if needed. Will update FMLA paperwork and return to patient/employer to reflect changes. He may continue taking gabapentin 100mg  up to twice daily as needed for reduce pain.  ?- gabapentin (NEURONTIN) 100 MG capsule; Take one at night for 7 days then 2 po q HS  Dispense: 180 capsule; Refill: 1 ? ?2. GAD (generalized anxiety disorder) ?Continue  prozac 20mg  daily. Continue buspirone 10mg  up to three times daily as needed for acute anxiety.  ?- busPIRone (BUSPAR) 10 MG tablet; Take 0.5-1 tablets (5-10 mg total) by mouth 3 (three) times daily.  Dispense: 270 tablet; Refill: 1 ?- FLUoxetine (PROZAC) 20 MG tablet; Take 1 tablet (20 mg total) by mouth daily.  Dispense: 90 tablet; Refill: 1 ? ?3. Vitamin D insufficiency ?Continue weekly vitamin d supplement. Refill sent to pharmacy today  ?- Vitamin D, Ergocalciferol, (DRISDOL) 1.25 MG (50000 UNIT) CAPS capsule; Take 1 capsule (50,000 Units total) by mouth every 7 (seven) days.  Dispense: 12 capsule; Refill: 1 ? ?4. Body mass index (BMI) of 30.0-30.9 in adult ?Weight gain of five pounds since last visit. Likely due to inactivity since back pain started. Encourage patient to limit calorie intake to 2000 cal/day or less.  He should consume a low cholesterol, low-fat diet.   ?  ?Problem List Items Addressed This Visit   ? ?  ? Nervous and Auditory  ? Lumbar disc disease with radiculopathy - Primary  ? Relevant Medications  ? busPIRone (BUSPAR) 10 MG tablet  ? FLUoxetine (PROZAC) 20 MG tablet  ? gabapentin (NEURONTIN) 100 MG capsule  ?  ? Other  ? GAD (generalized anxiety disorder) (Chronic)  ? Relevant Medications  ? busPIRone (BUSPAR) 10 MG tablet  ? FLUoxetine (PROZAC) 20 MG tablet  ? Vitamin D  insufficiency (Chronic)  ? Relevant Medications  ? Vitamin D, Ergocalciferol, (DRISDOL) 1.25 MG (50000 UNIT) CAPS capsule  ? Body mass index (BMI) of 30.0-30.9 in adult  ?  ? ?Return in about 6 weeks (around 06/04/2022

## 2022-05-27 ENCOUNTER — Ambulatory Visit: Payer: Self-pay

## 2022-05-27 ENCOUNTER — Encounter: Payer: Self-pay | Admitting: Physical Medicine and Rehabilitation

## 2022-05-27 ENCOUNTER — Ambulatory Visit (INDEPENDENT_AMBULATORY_CARE_PROVIDER_SITE_OTHER): Payer: BC Managed Care – PPO | Admitting: Physical Medicine and Rehabilitation

## 2022-05-27 VITALS — BP 136/86 | HR 74

## 2022-05-27 DIAGNOSIS — M5416 Radiculopathy, lumbar region: Secondary | ICD-10-CM | POA: Diagnosis not present

## 2022-05-27 MED ORDER — METHYLPREDNISOLONE ACETATE 80 MG/ML IJ SUSP
80.0000 mg | Freq: Once | INTRAMUSCULAR | Status: AC
  Administered 2022-05-27: 80 mg

## 2022-05-27 NOTE — Patient Instructions (Signed)

## 2022-05-27 NOTE — Progress Notes (Unsigned)
Pt state lower back pain that travels down his left leg to his foot. Pt state walking and laying down makes the pain worse. Pt state he take [place] meds to help ease his pain.  Numeric Pain Rating Scale and Functional Assessment Average Pain 7   In the last MONTH (on 0-10 scale) has pain interfered with the following?  1. General activity like being  able to carry out your everyday physical activities such as walking, climbing stairs, carrying groceries, or moving a chair?  Rating(10)   +Driver, -BT, -Dye Allergies.

## 2022-06-02 ENCOUNTER — Ambulatory Visit (INDEPENDENT_AMBULATORY_CARE_PROVIDER_SITE_OTHER): Payer: BC Managed Care – PPO | Admitting: Orthopaedic Surgery

## 2022-06-02 ENCOUNTER — Ambulatory Visit (INDEPENDENT_AMBULATORY_CARE_PROVIDER_SITE_OTHER): Payer: BC Managed Care – PPO

## 2022-06-02 VITALS — BP 136/91 | HR 71 | Ht 74.02 in | Wt 236.9 lb

## 2022-06-02 DIAGNOSIS — G8929 Other chronic pain: Secondary | ICD-10-CM | POA: Diagnosis not present

## 2022-06-02 DIAGNOSIS — M5116 Intervertebral disc disorders with radiculopathy, lumbar region: Secondary | ICD-10-CM | POA: Diagnosis not present

## 2022-06-02 DIAGNOSIS — M5030 Other cervical disc degeneration, unspecified cervical region: Secondary | ICD-10-CM

## 2022-06-02 DIAGNOSIS — M542 Cervicalgia: Secondary | ICD-10-CM

## 2022-06-02 NOTE — Progress Notes (Unsigned)
Office Visit Note   Patient: Samuel Vasquez           Date of Birth: 1981-02-23           MRN: [id number] Visit Date: 06/02/2022              Requested by: Carlean Jews, NP [address] [place],  [place] [address] PCP: Carlean Jews, NP   Assessment & Plan: Visit Diagnoses: No diagnosis found.  Plan: ***  Follow-Up Instructions: No follow-ups on file.   Orders:  No orders of the defined types were placed in this encounter.  No orders of the defined types were placed in this encounter.     Procedures: No procedures performed   Clinical Data: No additional findings.   Subjective: Chief Complaint  Patient presents with   Lower Back - Follow-up    HPI  Review of Systems   Objective: Vital Signs: BP (!) 136/91   Pulse 71   Ht 6' 2.02" (1.88 m)   Wt 236 lb 14.4 oz (107.5 kg)   BMI 30.40 kg/m   Physical Exam  Ortho Exam  Specialty Comments:  MRI LUMBAR SPINE WITHOUT CONTRAST   TECHNIQUE: Multiplanar, multisequence MR imaging of the lumbar spine was performed. No intravenous contrast was administered.   COMPARISON:  X-ray lumbar 10/03/2021.   FINDINGS: Segmentation:  Standard.   Alignment:  Physiologic.   Vertebrae: No acute fracture, evidence of discitis, or aggressive bone lesion. Tiny Schmorl's node involving the T12-L1, L2, L3 and L4 vertebral bodies.   Conus medullaris and cauda equina: Conus extends to the L1 level. Conus and cauda equina appear normal.   Paraspinal and other soft tissues: No acute paraspinal abnormality.   Disc levels:   Disc spaces: Disc spaces are maintained.   T12-L1: No significant disc bulge. No neural foraminal stenosis. No central canal stenosis.   L1-L2: No significant disc bulge. No neural foraminal stenosis. No central canal stenosis.   L2-L3: No significant disc bulge. No neural foraminal stenosis. No central canal stenosis.   L3-L4: No significant disc bulge. No neural foraminal  stenosis. No central canal stenosis.   L4-L5: No significant disc bulge. No neural foraminal stenosis. No central canal stenosis.   L5-S1: Small right paracentral disc protrusion with mass effect on the right intraspinal S1 nerve root. No foraminal or central canal stenosis.   IMPRESSION: 1. At L5-S1 there is a small right paracentral disc protrusion with mass effect on the right intraspinal S1 nerve root.     Electronically Signed   By: Elige Ko M.D.   On: 10/28/2021 09:54  Imaging: No results found.   PMFS History: Patient Active Problem List   Diagnosis Date Noted   Body mass index (BMI) of 30.0-30.9 in adult 04/23/2022   Left groin pain 02/14/2022   LDL-c greater than or equal to 100 mg/dl 20/25/4270   Heartburn 11/02/2021   Body mass index 29.0-29.9, adult 11/02/2021   Lumbar disc disease with radiculopathy 10/05/2021   Hypochondriasis 08/19/2019   Psychosomatic disorder 08/19/2019   Alopecia, male pattern 12/25/2016   Sleep difficulties 12/25/2016   HSV-1 infection 09/29/2016   Family history of hypertension 06/26/2016   Family history of mixed hyperlipidemia 06/26/2016   GAD (generalized anxiety disorder) 06/01/2016   Panic attack as reaction to stress 06/01/2016   Vitamin D insufficiency 06/01/2016   Overweight (BMI 25.0-29.9) 06/01/2016   Chronic cervical pain 09/05/2013   Past Medical History:  Diagnosis Date  Chronic cervical pain 09/05/2013   Chronic neck pain    Family history of hypertension 06/26/2016   Family history of mixed hyperlipidemia 06/26/2016   GAD (generalized anxiety disorder) 06/01/2016   Overweight (BMI 25.0-29.9) 06/01/2016   Panic attack as reaction to stress 06/01/2016   Vitamin D insufficiency 06/01/2016    Family History  Problem Relation Age of Onset   Hyperlipidemia Mother    Hypertension Mother    Hypertension Brother     Past Surgical History:  Procedure Laterality Date   TONSILLECTOMY     Social History    Occupational History   Not on file  Tobacco Use   Smoking status: Every Day    Packs/day: 1.00    Types: Cigarettes    Last attempt to quit: 12/21/2010    Years since quitting: 11.4   Smokeless tobacco: Never  Vaping Use   Vaping Use: Never used  Substance and Sexual Activity   Alcohol use: Yes    Comment: socially   Drug use: No   Sexual activity: Yes    Birth control/protection: None

## 2022-06-03 DIAGNOSIS — M5030 Other cervical disc degeneration, unspecified cervical region: Secondary | ICD-10-CM | POA: Insufficient documentation

## 2022-06-04 ENCOUNTER — Encounter: Payer: Self-pay | Admitting: Nurse Practitioner

## 2022-06-04 ENCOUNTER — Ambulatory Visit (INDEPENDENT_AMBULATORY_CARE_PROVIDER_SITE_OTHER): Payer: BC Managed Care – PPO | Admitting: Nurse Practitioner

## 2022-06-04 VITALS — BP 125/79 | HR 87 | Temp 97.8°F | Ht 74.02 in | Wt 236.0 lb

## 2022-06-04 DIAGNOSIS — Z6830 Body mass index (BMI) 30.0-30.9, adult: Secondary | ICD-10-CM

## 2022-06-04 DIAGNOSIS — R12 Heartburn: Secondary | ICD-10-CM

## 2022-06-04 DIAGNOSIS — M5116 Intervertebral disc disorders with radiculopathy, lumbar region: Secondary | ICD-10-CM | POA: Diagnosis not present

## 2022-06-04 MED ORDER — OMEPRAZOLE 40 MG PO CPDR: 40.0000 mg | DELAYED_RELEASE_CAPSULE | Freq: Two times a day (BID) | ORAL | 3 refills | Status: DC | PRN

## 2022-06-04 NOTE — Progress Notes (Signed)
Established patient visit   Patient: Samuel Vasquez   DOB: 1981-06-11   41 y.o. Male  MRN: [id number] Visit Date: 06/04/2022   Chief Complaint  Patient presents with   Follow-up   Subjective    HPI  The patient is here for follow up  -continues to have low back pain with weakness in the legs. Has seen orthopedics. Did have TFES which did help pain and weakness in the left side but then pain started on right side  -having neck pain. Numbness in hands and weakness in the arms. X-ray did show endplate spurring of the C5/C6 level.  -bilateral carpal tunnel.  -has brought disability forms which need to be completed and returned.  -having increased heartburn.    Medications: Outpatient Medications Prior to Visit  Medication Sig   Aspirin-Salicylamide-Caffeine (ARTHRITIS STRENGTH BC POWDER PO) Take by mouth as needed.   busPIRone (BUSPAR) 10 MG tablet Take 0.5-1 tablets (5-10 mg total) by mouth 3 (three) times daily.   cyclobenzaprine (FLEXERIL) 10 MG tablet Take 1 tablet by mouth 2 (two) times daily as needed.   diazepam (VALIUM) 5 MG tablet Take one tablet by mouth with food one hour prior to procedure. May repeat 30 minutes prior if needed.   FLUoxetine (PROZAC) 20 MG tablet Take 1 tablet (20 mg total) by mouth daily.   gabapentin (NEURONTIN) 100 MG capsule Take one at night for 7 days then 2 po q HS   Vitamin D, Ergocalciferol, (DRISDOL) 1.25 MG (50000 UNIT) CAPS capsule Take 1 capsule (50,000 Units total) by mouth every 7 (seven) days.   [DISCONTINUED] omeprazole (PRILOSEC) 20 MG capsule Take 1 capsule (20 mg total) by mouth 2 (two) times daily as needed.   No facility-administered medications prior to visit.    Review of Systems  Constitutional:  Positive for activity change. Negative for chills, fatigue and fever.       Patient with decreased activity due to pain and weakness in left leg. Now having weakness in right leg.  HENT:  Negative for congestion, postnasal drip,  rhinorrhea, sinus pressure, sinus pain, sneezing and sore throat.   Eyes: Negative.   Respiratory:  Negative for cough, shortness of breath and wheezing.   Cardiovascular:  Negative for chest pain and palpitations.  Gastrointestinal:  Negative for constipation, diarrhea, nausea and vomiting.       Has started having heartburn symptoms.  Endocrine: Negative for cold intolerance, heat intolerance, polydipsia and polyuria.  Genitourinary:  Negative for dysuria, frequency and urgency.  Musculoskeletal:  Positive for arthralgias, back pain, gait problem, myalgias, neck pain and neck stiffness.       Patient reports diminished ability to stand for extended periods of time.  Walking is painful, especially with weightbearing on left foot and leg.  States that pain started from left hip all the way down into the left big toe.  Feels like electric shock.  Causes weakness in the leg.  Has had little improvement in symptoms    Skin:  Negative for rash.  Allergic/Immunologic: Negative for environmental allergies.  Neurological:  Positive for weakness and numbness. Negative for dizziness and headaches.  Psychiatric/Behavioral:  Positive for dysphoric mood. The patient is nervous/anxious.        Doing better on current medications.        Objective     Today's Vitals   06/04/22 1038  BP: 125/79  Pulse: 87  Temp: 97.8 F (36.6 C)  SpO2: 97%  Weight: 236 lb (107 kg)  Height: 6' 2.02" (1.88 m)   Body mass index is 30.29 kg/m.   BP Readings from Last 3 Encounters:  06/04/22 125/79  06/02/22 (!) 136/91  05/27/22 136/86    Wt Readings from Last 3 Encounters:  06/04/22 236 lb (107 kg)  06/02/22 236 lb 14.4 oz (107.5 kg)  04/23/22 236 lb 14.4 oz (107.5 kg)    Physical Exam Vitals and nursing note reviewed.  Constitutional:      Appearance: Normal appearance. He is well-developed.  HENT:     Head: Normocephalic and atraumatic.     Nose: Nose normal.     Mouth/Throat:     Mouth: Mucous  membranes are moist.     Pharynx: Oropharynx is clear.  Eyes:     Extraocular Movements: Extraocular movements intact.     Conjunctiva/sclera: Conjunctivae normal.     Pupils: Pupils are equal, round, and reactive to light.  Cardiovascular:     Rate and Rhythm: Normal rate and regular rhythm.     Pulses: Normal pulses.     Heart sounds: Normal heart sounds.  Pulmonary:     Effort: Pulmonary effort is normal.     Breath sounds: Normal breath sounds.  Abdominal:     Palpations: Abdomen is soft.  Musculoskeletal:        General: Normal range of motion.     Cervical back: Normal range of motion and neck supple.     Comments: atient continues to have pain in the left leg, left groin, and left lower bac when sitting up from a seated position.  Grimaces when placing weight on the left foot and leg.  There is decreased strength with flexion and plantar flexion of the left foot. Bending and twisting at the waist also increasing pain, evidenced by grimacing.  No bony abnormalities or deformities are palpated at this time.       Lymphadenopathy:     Cervical: No cervical adenopathy.  Skin:    General: Skin is warm and dry.     Capillary Refill: Capillary refill takes less than 2 seconds.  Neurological:     General: No focal deficit present.     Mental Status: He is alert and oriented to person, place, and time.  Psychiatric:        Mood and Affect: Mood normal.        Behavior: Behavior normal.        Thought Content: Thought content normal.        Judgment: Judgment normal.       Assessment & Plan    1. Lumbar disc disease with radiculopathy Patient now seeing orthopedic provider.  Recently had TFESI.  Now having pain and weakness on the right side.  Also having cervical spinal pain.  Advised patient to make follow-up with orthopedic provider for additional treatment.  Will fill out current FMLA and disability paperwork and returned to patient when complete.  Advised patient that  further FMLA and disability paperwork should be done through orthopedics as they should be making decisions regarding work and patient's physical condition.  2. Heartburn Start Prilosec 40 mg daily.  Avoid trigger foods.  Sleep with HOB elevated to 30 degrees.  Reassess at next visit. - omeprazole (PRILOSEC) 40 MG capsule; Take 1 capsule (40 mg total) by mouth 2 (two) times daily as needed.  Dispense: 30 capsule; Refill: 3  3. Body mass index (BMI) of 30.0-30.9 in adult Encourage patient to limit calorie intake to 2000 cal/day or less.  He should consume a low cholesterol, low-fat diet.     Problem List Items Addressed This Visit       Nervous and Auditory   Lumbar disc disease with radiculopathy - Primary     Other   Heartburn   Relevant Medications   omeprazole (PRILOSEC) 40 MG capsule   Body mass index (BMI) of 30.0-30.9 in adult     Return in about 3 months (around 09/04/2022) for mood.         Carlean Jews, NP  [facility] at [facility] [phone] (phone) [phone] (fax)  [facility]

## 2022-06-12 NOTE — Progress Notes (Signed)
Samuel Vasquez - 41 y.o. male MRN [id number]  Date of birth: 1981-05-21  Office Visit Note: Visit Date: 05/27/2022 PCP: Carlean Jews, NP Referred by: Carlean Jews, NP  Subjective: Chief Complaint  Patient presents with   Lower Back - Pain   Left Leg - Pain   Left Foot - Pain   HPI:  Samuel Vasquez is a 41 y.o. male who comes in today at the request of Ellin Goodie, FNP for planned Left L5-S1 Lumbar Interlaminar epidural steroid injection with fluoroscopic guidance.  The patient has failed conservative care including home exercise, medications, time and activity modification.  This injection will be diagnostic and hopefully therapeutic.  Please see requesting physician notes for further details and justification.   ROS Otherwise per HPI.  Assessment & Plan: Visit Diagnoses:    ICD-10-CM   1. Lumbar radiculopathy  M54.16 XR C-ARM NO REPORT    Epidural Steroid injection    methylPREDNISolone acetate (DEPO-MEDROL) injection 80 mg      Plan: No additional findings.   Meds & Orders:  Meds ordered this encounter  Medications   methylPREDNISolone acetate (DEPO-MEDROL) injection 80 mg    Orders Placed This Encounter  Procedures   XR C-ARM NO REPORT   Epidural Steroid injection    Follow-up: Return if symptoms worsen or fail to improve.   Procedures: No procedures performed  Lumbar Epidural Steroid Injection - Interlaminar Approach with Fluoroscopic Guidance  Patient: Samuel Vasquez      Date of Birth: 1981-05-13 MRN: [id number] PCP: Carlean Jews, NP      Visit Date: 05/27/2022   Universal Protocol:     Consent Given By: the patient  Position: PRONE  Additional Comments: Vital signs were monitored before and after the procedure. Patient was prepped and draped in the usual sterile fashion. The correct patient, procedure, and site was verified.   Injection Procedure Details:   Procedure diagnoses: Lumbar radiculopathy [M54.16]   Meds Administered:   Meds ordered this encounter  Medications   methylPREDNISolone acetate (DEPO-MEDROL) injection 80 mg     Laterality: Left  Location/Site:  L5-S1  Needle: 3.5 in., 20 ga. Tuohy  Needle Placement: Paramedian epidural  Findings:   -Comments: Excellent flow of contrast into the epidural space.  Procedure Details: Using a paramedian approach from the side mentioned above, the region overlying the inferior lamina was localized under fluoroscopic visualization and the soft tissues overlying this structure were infiltrated with 4 ml. of 1% Lidocaine without Epinephrine. The Tuohy needle was inserted into the epidural space using a paramedian approach.   The epidural space was localized using loss of resistance along with counter oblique bi-planar fluoroscopic views.  After negative aspirate for air, blood, and CSF, a 2 ml. volume of Isovue-250 was injected into the epidural space and the flow of contrast was observed. Radiographs were obtained for documentation purposes.    The injectate was administered into the level noted above.   Additional Comments:  The patient tolerated the procedure well Dressing: 2 x 2 sterile gauze and Band-Aid    Post-procedure details: Patient was observed during the procedure. Post-procedure instructions were reviewed.  Patient left the clinic in stable condition.   Clinical History: MRI LUMBAR SPINE WITHOUT CONTRAST   TECHNIQUE: Multiplanar, multisequence MR imaging of the lumbar spine was performed. No intravenous contrast was administered.   COMPARISON:  X-ray lumbar 10/03/2021.   FINDINGS: Segmentation:  Standard.   Alignment:  Physiologic.   Vertebrae: No acute fracture, evidence of  discitis, or aggressive bone lesion. Tiny Schmorl's node involving the T12-L1, L2, L3 and L4 vertebral bodies.   Conus medullaris and cauda equina: Conus extends to the L1 level. Conus and cauda equina appear normal.   Paraspinal and other soft tissues:  No acute paraspinal abnormality.   Disc levels:   Disc spaces: Disc spaces are maintained.   T12-L1: No significant disc bulge. No neural foraminal stenosis. No central canal stenosis.   L1-L2: No significant disc bulge. No neural foraminal stenosis. No central canal stenosis.   L2-L3: No significant disc bulge. No neural foraminal stenosis. No central canal stenosis.   L3-L4: No significant disc bulge. No neural foraminal stenosis. No central canal stenosis.   L4-L5: No significant disc bulge. No neural foraminal stenosis. No central canal stenosis.   L5-S1: Small right paracentral disc protrusion with mass effect on the right intraspinal S1 nerve root. No foraminal or central canal stenosis.   IMPRESSION: 1. At L5-S1 there is a small right paracentral disc protrusion with mass effect on the right intraspinal S1 nerve root.     Electronically Signed   By: Elige Ko M.D.   On: 10/28/2021 09:54     Objective:  VS:  HT:    WT:   BMI:     BP:136/86  HR:74bpm  TEMP: ( )  RESP:  Physical Exam Vitals and nursing note reviewed.  Constitutional:      General: He is not in acute distress.    Appearance: Normal appearance. He is not ill-appearing.  HENT:     Head: Normocephalic and atraumatic.     Right Ear: External ear normal.     Left Ear: External ear normal.     Nose: No congestion.  Eyes:     Extraocular Movements: Extraocular movements intact.  Cardiovascular:     Rate and Rhythm: Normal rate.     Pulses: Normal pulses.  Pulmonary:     Effort: Pulmonary effort is normal. No respiratory distress.  Abdominal:     General: There is no distension.     Palpations: Abdomen is soft.  Musculoskeletal:        General: No tenderness or signs of injury.     Cervical back: Neck supple.     Right lower leg: No edema.     Left lower leg: No edema.     Comments: Patient has good distal strength without clonus.  Skin:    Findings: No erythema or rash.   Neurological:     General: No focal deficit present.     Mental Status: He is alert and oriented to person, place, and time.     Sensory: No sensory deficit.     Motor: No weakness or abnormal muscle tone.     Coordination: Coordination normal.  Psychiatric:        Mood and Affect: Mood normal.        Behavior: Behavior normal.      Imaging: No results found.

## 2022-09-03 NOTE — Progress Notes (Signed)
Established patient visit   Patient: Samuel Vasquez   DOB: 05-04-1981   41 y.o. Male  MRN: [id number] Visit Date: 09/04/2022   Chief Complaint  Patient presents with   Follow-up   Subjective    HPI  Follow up generalized anxiety  -taking fluoxetine 20 mg daily  -buspirone 10 mg up to three times daily as needed  -was being treated for lumbar and possibly cervical disc disease -has been seeing orthopedics. Got at least one TFES injection which did help some.  -currently not receiving further treatment. Unemployed at the moment. He is trying to get Medicaid.  -he does have paperwork from disability lawyer to have filled out and returned ss soon as possible.    Medications: Outpatient Medications Prior to Visit  Medication Sig   Aspirin-Salicylamide-Caffeine (ARTHRITIS STRENGTH BC POWDER PO) Take by mouth as needed.   cyclobenzaprine (FLEXERIL) 10 MG tablet Take 1 tablet by mouth 2 (two) times daily as needed.   diazepam (VALIUM) 5 MG tablet Take one tablet by mouth with food one hour prior to procedure. May repeat 30 minutes prior if needed.   Vitamin D, Ergocalciferol, (DRISDOL) 1.25 MG (50000 UNIT) CAPS capsule Take 1 capsule (50,000 Units total) by mouth every 7 (seven) days.   [DISCONTINUED] busPIRone (BUSPAR) 10 MG tablet Take 0.5-1 tablets (5-10 mg total) by mouth 3 (three) times daily.   [DISCONTINUED] FLUoxetine (PROZAC) 20 MG tablet Take 1 tablet (20 mg total) by mouth daily.   [DISCONTINUED] gabapentin (NEURONTIN) 100 MG capsule Take one at night for 7 days then 2 po q HS   [DISCONTINUED] omeprazole (PRILOSEC) 40 MG capsule Take 1 capsule (40 mg total) by mouth 2 (two) times daily as needed.   No facility-administered medications prior to visit.    Review of Systems  Constitutional:  Negative for activity change, chills, fatigue and fever.       Four  pound weight gain since last visit. Patient with decreased activity due to pain and weakness in left leg. Now having  weakness in right leg.   HENT:  Negative for congestion, postnasal drip, rhinorrhea, sinus pressure, sinus pain, sneezing and sore throat.   Eyes: Negative.   Respiratory:  Negative for cough, shortness of breath and wheezing.   Cardiovascular:  Negative for chest pain and palpitations.  Gastrointestinal:  Negative for constipation, diarrhea, nausea and vomiting.  Endocrine: Negative for cold intolerance, heat intolerance, polydipsia and polyuria.  Genitourinary:  Negative for dysuria, frequency and urgency.  Musculoskeletal:  Positive for arthralgias, back pain and myalgias.         Patient reports diminished ability to stand for extended periods of time.  Walking is painful, especially with weightbearing on left foot and leg.  States that pain started from left hip all the way down into the left big toe.  Feels like electric shock.  Causes weakness in the leg.  Has had little improvement in symptoms     Skin:  Negative for rash.  Allergic/Immunologic: Negative for environmental allergies.  Neurological:  Negative for dizziness, weakness and headaches.  Psychiatric/Behavioral:  Positive for dysphoric mood. The patient is nervous/anxious.        Objective     Today's Vitals   09/04/22 0931  BP: 119/80  Pulse: 69  SpO2: 98%  Weight: 240 lb 6.4 oz (109 kg)  Height: 6' 2.02" (1.88 m)   Body mass index is 30.85 kg/m.   BP Readings from Last 3 Encounters:  09/04/22 119/80  06/04/22 125/79  06/02/22 (Abnormal) 136/91    Wt Readings from Last 3 Encounters:  09/04/22 240 lb 6.4 oz (109 kg)  06/04/22 236 lb (107 kg)  06/02/22 236 lb 14.4 oz (107.5 kg)    Physical Exam Vitals and nursing note reviewed.  Constitutional:      Appearance: Normal appearance. He is well-developed.  HENT:     Head: Normocephalic.     Nose: Nose normal.     Mouth/Throat:     Mouth: Mucous membranes are moist.     Pharynx: Oropharynx is clear.  Eyes:     Extraocular Movements: Extraocular movements  intact.     Conjunctiva/sclera: Conjunctivae normal.     Pupils: Pupils are equal, round, and reactive to light.  Cardiovascular:     Rate and Rhythm: Normal rate and regular rhythm.     Pulses: Normal pulses.     Heart sounds: Normal heart sounds.  Pulmonary:     Effort: Pulmonary effort is normal.     Breath sounds: Normal breath sounds.  Abdominal:     Palpations: Abdomen is soft.  Musculoskeletal:        General: Normal range of motion.     Cervical back: Normal range of motion and neck supple.     Comments: Patient continues to have pain in the left leg, left groin, and left lower bac when sitting up from a seated position.  Grimaces when placing weight on the left foot and leg.  There is decreased strength with flexion and plantar flexion of the left foot. Bending and twisting at the waist also increasing pain, evidenced by grimacing.  No bony abnormalities or deformities are palpated at this time.   Lymphadenopathy:     Cervical: No cervical adenopathy.  Skin:    General: Skin is warm and dry.     Capillary Refill: Capillary refill takes less than 2 seconds.  Neurological:     General: No focal deficit present.     Mental Status: He is alert and oriented to person, place, and time.  Psychiatric:        Mood and Affect: Mood normal.        Behavior: Behavior normal.        Thought Content: Thought content normal.        Judgment: Judgment normal.      Assessment & Plan    1. GAD (generalized anxiety disorder) Stable.  Continue Prozac 20 mg daily.  May take BuSpar 5 to 10 mg up to 3 times daily as needed for acute anxiety. - busPIRone (BUSPAR) 10 MG tablet; Take 0.5-1 tablets (5-10 mg total) by mouth 3 (three) times daily.  Dispense: 270 tablet; Refill: 1 - FLUoxetine (PROZAC) 20 MG tablet; Take 1 tablet (20 mg total) by mouth daily.  Dispense: 90 tablet; Refill: 1  2. Lumbar disc disease with radiculopathy Continue gabapentin 100 mg and up to 200 mg every evening.  Refill  sent to pharmacy today. - gabapentin (NEURONTIN) 100 MG capsule; Take one at night for 7 days then 2 po q HS  Dispense: 180 capsule; Refill: 1  3. Heartburn Continue omeprazole twice daily as needed. - omeprazole (PRILOSEC) 40 MG capsule; Take 1 capsule (40 mg total) by mouth 2 (two) times daily as needed.  Dispense: 30 capsule; Refill: 3  4. BMI 30.0-30.9,adult Trial phentermine 37.5 mg tablets daily. Discussed lowering calorie intake to 1500 calories per day and incorporating exercise into daily routine to help lose weight.  - phentermine (ADIPEX-P) 37.5  MG tablet; Take 1 tablet (37.5 mg total) by mouth daily before breakfast.  Dispense: 30 tablet; Refill: 0   Problem List Items Addressed This Visit       Nervous and Auditory   Lumbar disc disease with radiculopathy   Relevant Medications   busPIRone (BUSPAR) 10 MG tablet   FLUoxetine (PROZAC) 20 MG tablet   gabapentin (NEURONTIN) 100 MG capsule   phentermine (ADIPEX-P) 37.5 MG tablet     Other   GAD (generalized anxiety disorder) - Primary (Chronic)   Relevant Medications   busPIRone (BUSPAR) 10 MG tablet   FLUoxetine (PROZAC) 20 MG tablet   Heartburn   Relevant Medications   omeprazole (PRILOSEC) 40 MG capsule   BMI 30.0-30.9,adult   Relevant Medications   phentermine (ADIPEX-P) 37.5 MG tablet     Return in about 4 weeks (around 10/02/2022) for routine - weight management.         Carlean Jews, NP  [facility] at [facility] [phone] (phone) [phone] (fax)  [facility]

## 2022-09-04 ENCOUNTER — Encounter: Payer: Self-pay | Admitting: Nurse Practitioner

## 2022-09-04 ENCOUNTER — Ambulatory Visit (INDEPENDENT_AMBULATORY_CARE_PROVIDER_SITE_OTHER): Payer: BC Managed Care – PPO | Admitting: Nurse Practitioner

## 2022-09-04 VITALS — BP 119/80 | HR 69 | Ht 74.02 in | Wt 240.4 lb

## 2022-09-04 DIAGNOSIS — F411 Generalized anxiety disorder: Secondary | ICD-10-CM | POA: Diagnosis not present

## 2022-09-04 DIAGNOSIS — M5116 Intervertebral disc disorders with radiculopathy, lumbar region: Secondary | ICD-10-CM | POA: Diagnosis not present

## 2022-09-04 DIAGNOSIS — Z6830 Body mass index (BMI) 30.0-30.9, adult: Secondary | ICD-10-CM | POA: Diagnosis not present

## 2022-09-04 DIAGNOSIS — R12 Heartburn: Secondary | ICD-10-CM | POA: Diagnosis not present

## 2022-09-04 MED ORDER — FLUOXETINE HCL 20 MG PO TABS: 20.0000 mg | ORAL_TABLET | Freq: Every day | ORAL | 1 refills | Status: AC

## 2022-09-04 MED ORDER — BUSPIRONE HCL 10 MG PO TABS: 5.0000 mg | ORAL_TABLET | Freq: Three times a day (TID) | ORAL | 1 refills | Status: AC

## 2022-09-04 MED ORDER — OMEPRAZOLE 40 MG PO CPDR: 40.0000 mg | DELAYED_RELEASE_CAPSULE | Freq: Two times a day (BID) | ORAL | 3 refills | Status: AC | PRN

## 2022-09-04 MED ORDER — GABAPENTIN 100 MG PO CAPS: ORAL_CAPSULE | ORAL | 1 refills | Status: DC

## 2022-09-04 MED ORDER — PHENTERMINE HCL 37.5 MG PO TABS: 37.5000 mg | ORAL_TABLET | Freq: Every day | ORAL | 0 refills | Status: AC

## 2022-10-02 ENCOUNTER — Ambulatory Visit: Payer: BC Managed Care – PPO | Admitting: Nurse Practitioner

## 2022-10-05 NOTE — Progress Notes (Deleted)
Established patient visit   Patient: Samuel Vasquez   DOB: July 01, 1981   41 y.o. Male  MRN: [id number] Visit Date: 10/06/2022   No chief complaint on file.  Subjective    HPI  Follow up -weight management  -trial phentermine at last visit.09/04/2022. -initial  weight 09/04/2022 - 240 lbs -today's weight 10/05/2022 -  -weight loss since most recent visit -  -negative side effects from starting phentermine   Medications: Outpatient Medications Prior to Visit  Medication Sig   Aspirin-Salicylamide-Caffeine (ARTHRITIS STRENGTH BC POWDER PO) Take by mouth as needed.   busPIRone (BUSPAR) 10 MG tablet Take 0.5-1 tablets (5-10 mg total) by mouth 3 (three) times daily.   cyclobenzaprine (FLEXERIL) 10 MG tablet Take 1 tablet by mouth 2 (two) times daily as needed.   diazepam (VALIUM) 5 MG tablet Take one tablet by mouth with food one hour prior to procedure. May repeat 30 minutes prior if needed.   FLUoxetine (PROZAC) 20 MG tablet Take 1 tablet (20 mg total) by mouth daily.   gabapentin (NEURONTIN) 100 MG capsule Take one at night for 7 days then 2 po q HS   omeprazole (PRILOSEC) 40 MG capsule Take 1 capsule (40 mg total) by mouth 2 (two) times daily as needed.   phentermine (ADIPEX-P) 37.5 MG tablet Take 1 tablet (37.5 mg total) by mouth daily before breakfast.   Vitamin D, Ergocalciferol, (DRISDOL) 1.25 MG (50000 UNIT) CAPS capsule Take 1 capsule (50,000 Units total) by mouth every 7 (seven) days.   No facility-administered medications prior to visit.    Review of Systems  {Labs (Optional):23779}   Objective    There were no vitals filed for this visit. There is no height or weight on file to calculate BMI.  BP Readings from Last 3 Encounters:  09/04/22 119/80  06/04/22 125/79  06/02/22 (Abnormal) 136/91    Wt Readings from Last 3 Encounters:  09/04/22 240 lb 6.4 oz (109 kg)  06/04/22 236 lb (107 kg)  06/02/22 236 lb 14.4 oz (107.5 kg)    Physical Exam  ***  No results  found for any visits on 10/06/22.  Assessment & Plan     Problem List Items Addressed This Visit   None    No follow-ups on file.         Ronnell Freshwater, NP  [facility] at [facility] [phone] (phone) [phone] (fax)  [place]

## 2022-10-06 ENCOUNTER — Ambulatory Visit: Payer: BC Managed Care – PPO | Admitting: Nurse Practitioner

## 2022-11-03 ENCOUNTER — Ambulatory Visit: Payer: BC Managed Care – PPO | Admitting: Nurse Practitioner

## 2023-02-09 ENCOUNTER — Telehealth: Payer: Self-pay | Admitting: Physical Medicine and Rehabilitation

## 2023-02-09 NOTE — Telephone Encounter (Signed)
Judie Petit is working from home. Signing off.

## 2023-02-09 NOTE — Telephone Encounter (Signed)
Pt called with a worker's comp request to be seen by Dr, Ernestina Patches. Got as much info from pt anf left message for [facility] Adjustor to call with info we don't have. Left vm for her to contact Tisha ot Tammy. WC Adjustor name is Chareen (Pt not sure of spelling of 1st name) Freda Munro [place] claim number [id number]. Phone number to Ms. Freda Munro is [phone]. I have WC slip in basket on XU/Dean side.

## 2023-02-09 NOTE — Telephone Encounter (Signed)
patient states his lower back is having some issues/ he is now under a workers comp calim..patient will be sending that information to [place]. / claim number is --being given to crystal to finish getting information and sending it to you(Brittany)

## 2023-02-10 NOTE — Telephone Encounter (Signed)
See previous encounter

## 2023-02-15 ENCOUNTER — Ambulatory Visit (INDEPENDENT_AMBULATORY_CARE_PROVIDER_SITE_OTHER): Payer: No Typology Code available for payment source

## 2023-02-15 ENCOUNTER — Ambulatory Visit (INDEPENDENT_AMBULATORY_CARE_PROVIDER_SITE_OTHER): Payer: No Typology Code available for payment source | Admitting: Orthopedic Surgery

## 2023-02-15 ENCOUNTER — Encounter: Payer: Self-pay | Admitting: Orthopedic Surgery

## 2023-02-15 VITALS — BP 145/94 | HR 83 | Ht 74.0 in | Wt 241.0 lb

## 2023-02-15 DIAGNOSIS — M5416 Radiculopathy, lumbar region: Secondary | ICD-10-CM | POA: Diagnosis not present

## 2023-02-15 MED ORDER — METHYLPREDNISOLONE 4 MG PO TBPK: ORAL_TABLET | ORAL | 0 refills | Status: DC

## 2023-02-15 NOTE — Progress Notes (Signed)
Orthopedic Spine Surgery Office Note  Assessment: Patient is a 42 y.o. male with low back pain that radiates down the right lower extremity.  Has a history of a right-sided L5/S1 disc herniation so this may be a recurrence causing lumbar radiculopathy   Plan: -Explained that initially conservative treatment is tried as a significant number of patients may experience relief with these treatment modalities. Discussed that the conservative treatments include:  -activity modification  -physical therapy  -over the counter pain medications  -medrol dosepak  -lumbar steroid injections -Patient has tried gabapentin, Tylenol, muscle relaxer, lumbar brace -Recommended Medrol Dosepak and physical therapy -If he is not doing any better at her next visit, will recommend MRI of the lumbar spine to evaluate for radiculopathy -Would need to quit nicotine containing products if we ever consider surgery as a treatment option -Patient should return to office in 6 weeks, x-rays at next visit: none   Patient expressed understanding of the plan and all questions were answered to the patient's satisfaction.   ___________________________________________________________________________   History:  Patient is a 42 y.o. male who presents today for lumbar spine.  Patient states that he was at work on 02/01/2023 when he went to lift a dock plate that had fallen.  When he went to pick it up, he noticed a pop in his back and felt acute onset of pain.  Pain starts in his low back and radiates down the right lower extremity.  He feels it goes along the lateral aspect of the thigh and the posterior aspect of the leg into the ankle.  It does not go into the foot.  He has no symptoms on the left side.  He has tried gabapentin, Tylenol, muscle relaxer, lumbar brace but has not gotten any significant relief with these treatments.  Pain is worse if he is active and gets a little bit better if he is at rest but does not completely  go away.   Weakness: Yes, right leg feels weaker at times Symptoms of imbalance: Denies Paresthesias and numbness: Yes, in the toes of the right foot.  No other numbness or paresthesias Bowel or bladder incontinence: Denies Saddle anesthesia: Denies  Treatments tried: gabapentin, Tylenol, muscle relaxer, lumbar brace  Review of systems: Denies fevers and chills, night sweats, unexplained weight loss, history of cancer, pain that wakes them at night  Past medical history: Chronic pain Generalized anxiety disorder Panic attacks  Allergies: NKDA  Past surgical history:  Tonsillectomy  Social history: Reports use of nicotine product (smoking, vaping, patches, smokeless) Alcohol use: Yes, 2 drinks per week Denies recreational drug use   Physical Exam:  General: no acute distress, appears stated age Neurologic: alert, answering questions appropriately, following commands Respiratory: unlabored breathing on room air, symmetric chest rise Psychiatric: appropriate affect, normal cadence to speech   MSK (spine):  -Strength exam      Left  Right EHL    5/5  5/5 TA    5/5  5/5 GSC    5/5  5/5 Knee extension  5/5  5/5 Hip flexion   5/5  5/5  -Sensory exam    Sensation intact to light touch in L3-S1 nerve distributions of bilateral lower extremities  -Achilles DTR: 2/4 on the left, 2/4 on the right -Patellar tendon DTR: 2/4 on the left, 2/4 on the right  -Straight leg raise: Negative -Contralateral straight leg raise: Negative -Femoral nerve stretch test: Negative bilaterally -Clonus: no beats bilaterally  -Left hip exam: No pain through range  of motion, negative Stinchfield, negative FABER -Right hip exam: No pain through range of motion, negative Stinchfield, negative FABER  Imaging: XR of the lumbar spine from 02/15/2023 was independently reviewed and interpreted, showing disc height loss at L5/S1.  No fracture or dislocation.  No evidence of instability on  flexion/extension views.   MRI of the lumbar spine from 10/25/2021 was independently reviewed and interpreted, showing right paracentral L5/S1 disc herniation.    Patient name: Samuel Vasquez Patient MRN: [id number] Date of visit: 02/15/23

## 2023-03-29 ENCOUNTER — Ambulatory Visit: Payer: BC Managed Care – PPO | Admitting: Orthopedic Surgery

## 2023-04-07 ENCOUNTER — Ambulatory Visit (INDEPENDENT_AMBULATORY_CARE_PROVIDER_SITE_OTHER): Payer: No Typology Code available for payment source | Admitting: Orthopedic Surgery

## 2023-04-07 DIAGNOSIS — M5416 Radiculopathy, lumbar region: Secondary | ICD-10-CM | POA: Diagnosis not present

## 2023-04-07 NOTE — Progress Notes (Signed)
Orthopedic Spine Surgery Office Note  Assessment: Patient is a 42 y.o. male with low back pain that radiates into the right lower extremity.  Feels it along the lateral aspect of the thigh and the leg.   Plan: -Patient has tried gabapentin, Tylenol, muscle relaxer, lumbar brace  -Patient has tried over 6 weeks of conservative treatment without relief, so recommend MRI of the lumbar spine to evaluate for radiculopathy -Referral provided him for his physical therapy in [facility] -Explained that his groin pain may be coming from the hip as he has positive physical exam findings for it and points to the groin -Patient should return to office in 4 weeks, x-rays at next visit: right hip XRs   Patient expressed understanding of the plan and all questions were answered to the patient's satisfaction.   ___________________________________________________________________________  History: Patient is a 42 y.o. male who has been previously seen in the office for symptoms consistent with lumbar radiculopathy.  Patient completed the Medrol Dosepak and was taking Tylenol as instructed.  He did not get any relief with those treatment modalities.  He still having pain that starts in his low back and radiates along the lateral aspect of the right thigh and leg.  He feels this pain on a daily basis.  He sometimes gets similar pain in the left lower extremity.  He feels pain rarely on the left side.  Since her last visit, he has noticed right groin pain.  There is no trauma or injury that brought on the pain.  He feels that on a daily basis now.  It is as severe as his radiating right lower extremity pain.  No groin pain on the left side.  Denies paresthesia numbness.  No bowel or bladder incontinence.  No saddle anesthesia.  Previous treatments: gabapentin, Tylenol, muscle relaxer, lumbar brace    Physical Exam:  General: no acute distress, appears stated age Neurologic: alert, answering questions  appropriately, following commands Respiratory: unlabored breathing on room air, symmetric chest rise Psychiatric: appropriate affect, normal cadence to speech   MSK (spine):  -Strength exam      Left  Right EHL    5/5  5/5 TA    5/5  4/5 GSC    5/5  5/5 Knee extension  5/5  5/5 Hip flexion   5/5  5/5  Demonstrates cogwheel loss of strength in the right TA  -Sensory exam    Sensation intact to light touch in L3-S1 nerve distributions of bilateral lower extremities  -Achilles DTR: 2/4 on the left, 2/4 on the right -Patellar tendon DTR: 2/4 on the left, 2/4 on the right  -Straight leg raise: negative bilaterally -Femoral nerve stretch test: negative bilaterally -Clonus: no beats bilaterally  Imaging: XR of the lumbar spine from 02/15/2023 was previously independently reviewed and interpreted, showing disc height loss at L5/S1.  No fracture or dislocation.  No evidence of instability on flexion/extension views.    Patient name: Samuel Vasquez Patient MRN: [id number] Date of visit: 04/07/23

## 2023-04-21 ENCOUNTER — Ambulatory Visit (HOSPITAL_BASED_OUTPATIENT_CLINIC_OR_DEPARTMENT_OTHER)
Admission: RE | Admit: 2023-04-21 | Discharge: 2023-04-21 | Disposition: A | Discharge status: Home / Self Care | Payer: Medicaid Other | Source: Ambulatory Visit | Attending: Orthopedic Surgery | Admitting: Orthopedic Surgery

## 2023-04-21 DIAGNOSIS — M5416 Radiculopathy, lumbar region: Secondary | ICD-10-CM | POA: Insufficient documentation

## 2023-05-12 ENCOUNTER — Ambulatory Visit (INDEPENDENT_AMBULATORY_CARE_PROVIDER_SITE_OTHER): Payer: No Typology Code available for payment source | Admitting: Orthopedic Surgery

## 2023-05-12 DIAGNOSIS — M5116 Intervertebral disc disorders with radiculopathy, lumbar region: Secondary | ICD-10-CM | POA: Diagnosis not present

## 2023-05-12 MED ORDER — GABAPENTIN 300 MG PO CAPS: 300.0000 mg | ORAL_CAPSULE | Freq: Three times a day (TID) | ORAL | 0 refills | Status: AC

## 2023-05-12 NOTE — Progress Notes (Signed)
Orthopedic Spine Surgery Office Note   Assessment: Patient is a 42 y.o. male with low back pain that radiates into the right lower extremity.  Feels it along the lateral aspect of the thigh and the leg.     Plan: -Patient has tried gabapentin, Tylenol, muscle relaxer, lumbar brace  -Patient feels pain is more tolerable with gabapentin. New prescription provided to him today.  -Recommended trial of an L5/S1 injection. Referral provided to him today -Patient should return to office in 6 weeks, x-rays at next visit: none     Patient expressed understanding of the plan and all questions were answered to the patient's satisfaction.    ___________________________________________________________________________   History: Patient is a 42 y.o. male who has been previously seen in the office for symptoms consistent with lumbar radiculopathy. He has been having pain radiating down the lateral aspect of his right leg. No symptoms in the left lower extremity. He did feel like his pain was more tolerable when taking the gabapentin. No other treatments have helped him. Denies paresthesias and numbness.    Previous treatments: gabapentin, Tylenol, muscle relaxer, lumbar brace      Physical Exam:   General: no acute distress, appears stated age Neurologic: alert, answering questions appropriately, following commands Respiratory: unlabored breathing on room air, symmetric chest rise Psychiatric: appropriate affect, normal cadence to speech     MSK (spine):   -Strength exam                                                   Left                  Right EHL                              5/5                  4/5 TA                                 5/5                  4/5 GSC                             5/5                  5/5 Knee extension            5/5                  5/5 Hip flexion                    5/5                  5/5    -Sensory exam                           Sensation intact to  light touch in L3-S1 nerve distributions of bilateral lower extremities   -Achilles DTR: 2/4 on the left, 2/4 on the right -Patellar tendon DTR: 2/4 on the left, 2/4 on the right   -  Straight leg raise: negative bilaterally -Femoral nerve stretch test: negative bilaterally -Clonus: no beats bilaterally   Imaging: XR of the lumbar spine from 02/15/2023 was previously independently reviewed and interpreted, showing disc height loss at L5/S1.  No fracture or dislocation.  No evidence of instability on flexion/extension views.   MRI of the lumbar spine from 04/21/2023 was independently reviewed and interpreted, showing right paracentral disc herniation at L5/S1. No other significant stenosis seen.      Patient name: Samuel Vasquez Patient MRN: [id number] Date of visit: 05/12/23

## 2023-05-16 IMAGING — MR MR LUMBAR SPINE W/O CM
4 of 5 series · 27 of 48 positions shown · non-contrast
Comparison: X-ray lumbar 10/03/2021.

CLINICAL DATA: Intervertebral disc disorder. Patient reports lumbar
disc disease with left leg radiculopathy, and notes that he injured
himself from the repetitive motion of being up and down. No history
of surgery reported.

EXAM:
MRI LUMBAR SPINE WITHOUT CONTRAST
TECHNIQUE: Multiplanar, multisequence MR imaging of the lumbar spine was
performed. No intravenous contrast was administered.

[Series 2: T2 · sagittal · 4.0mm · 0.53mm/px · 6 of 13 slices shown (1 of 2)]
[im 1/13]
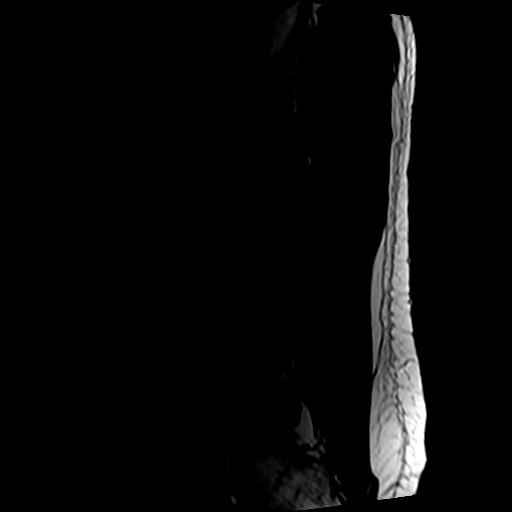
[im 3/13]
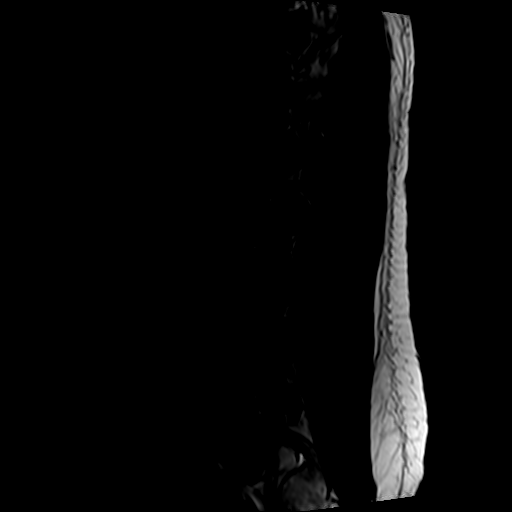
[im 5/13]
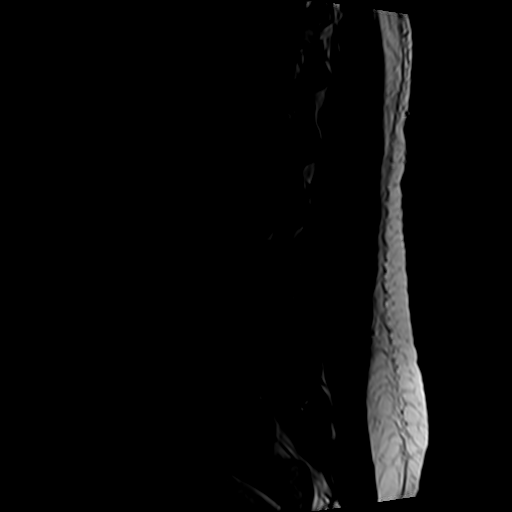
[im 8/13]
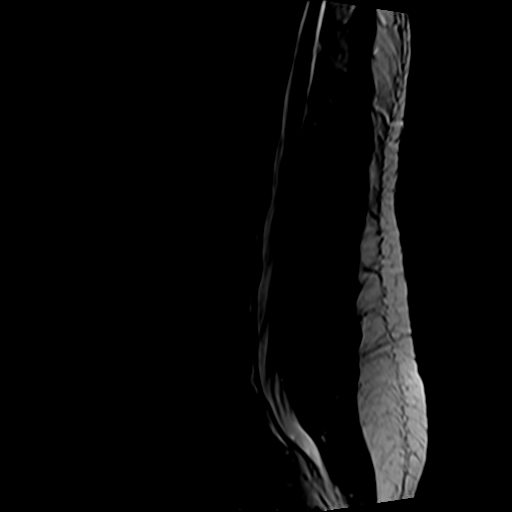
[im 10/13]
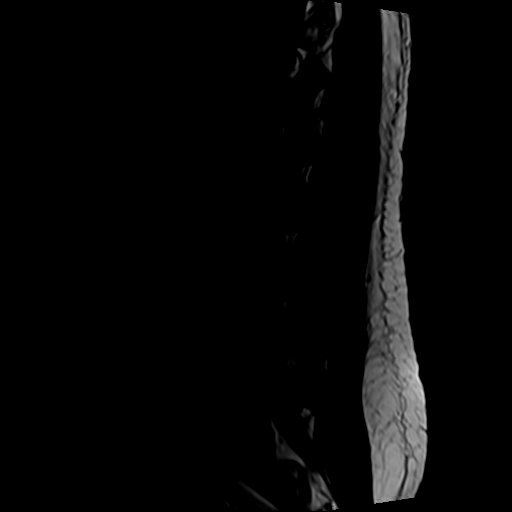
[im 13/13]
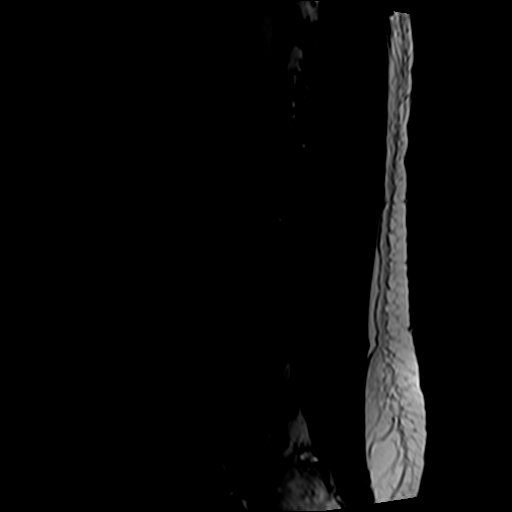

[Series 4: T1 · sagittal · 4.0mm · 0.53mm/px · 5 of 13 slices shown (1 of 2)]
[im 1/13]
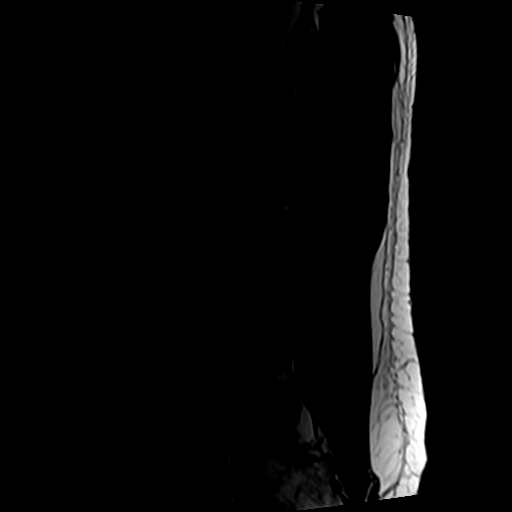
[im 4/13]
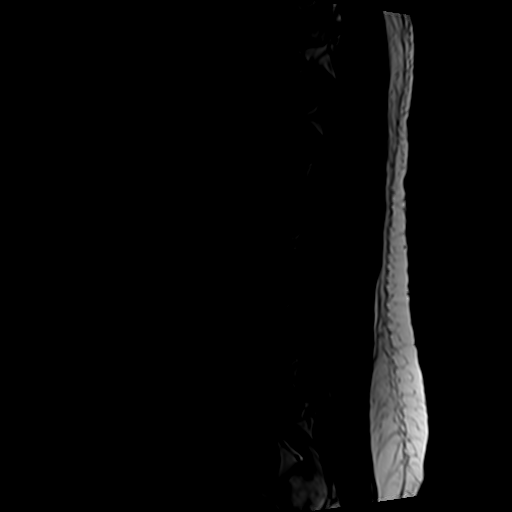
[im 7/13]
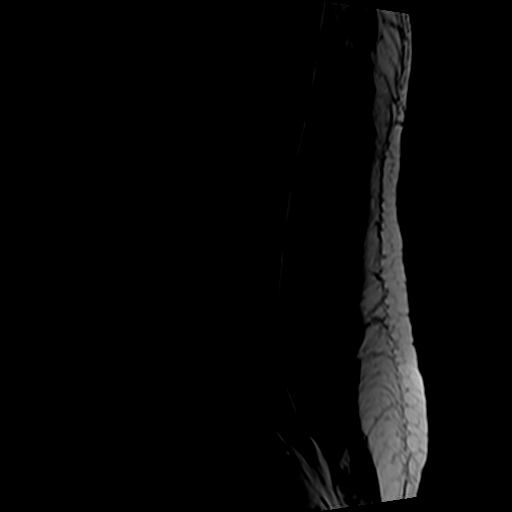
[im 10/13]
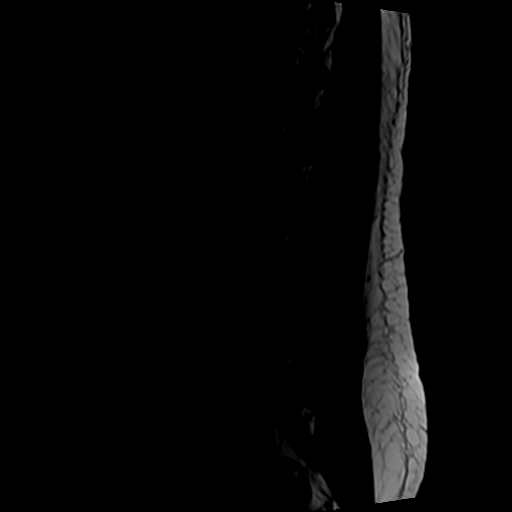
[im 13/13]
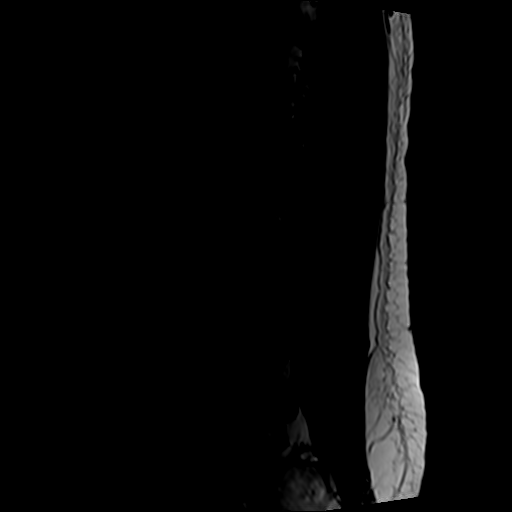

[Series 5: T2 · axial · 4.0mm · 0.70mm/px · z∈[-71,+148]mm · 10 of 42 slices shown (2 of 2)]
[im 3/42]
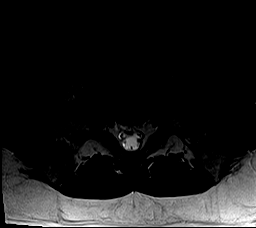
[im 6/42]
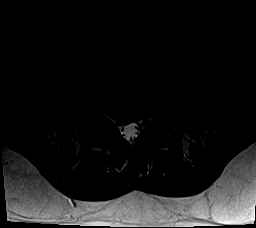
[im 9/42]
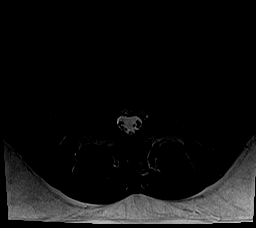
[im 14/42]
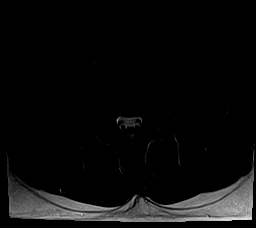
[im 20/42]
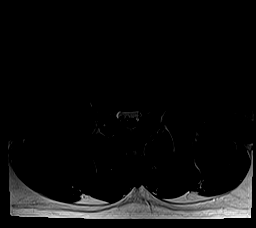
[im 22/42]
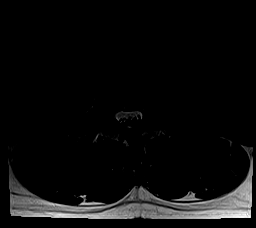
[im 25/42]
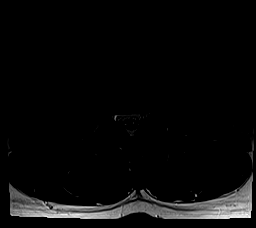
[im 31/42]
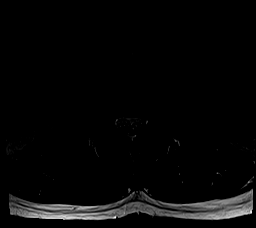
[im 36/42]
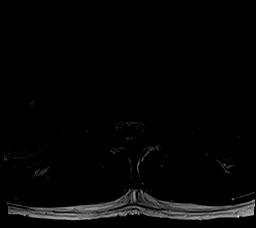
[im 42/42]
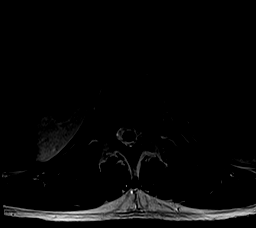

[Series 6: T1 · axial · 4.0mm · 0.35mm/px · z∈[-71,+117]mm · 6 of 42 slices shown (2 of 2)]
[im 3/42]
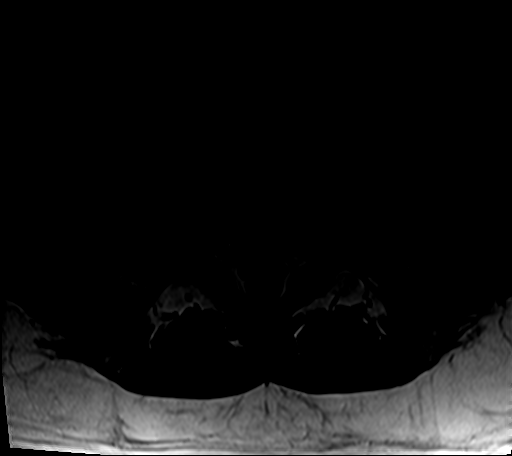
[im 6/42]
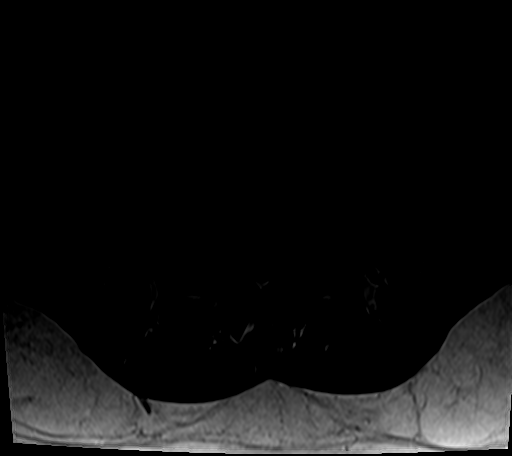
[im 9/42]
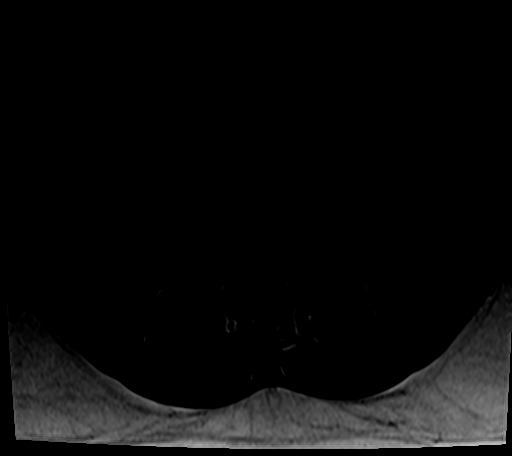
[im 14/42]
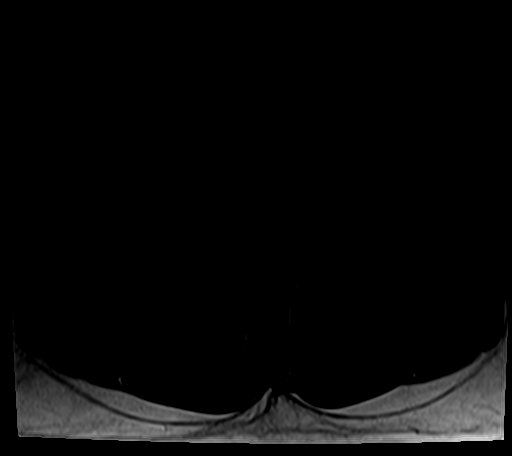
[im 22/42]
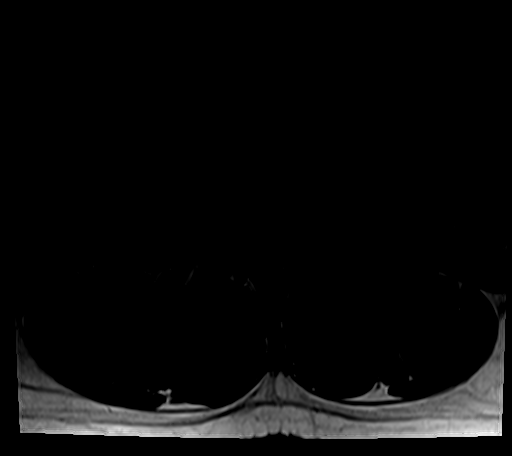
[im 36/42]
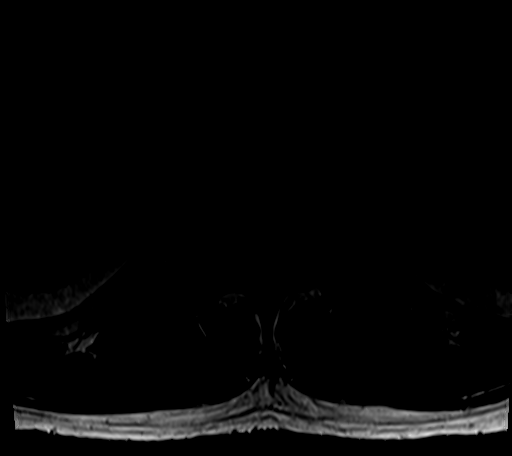

[27 of 48 positions shown; findings below may reference images not displayed]

FINDINGS: Segmentation:  Standard.

Alignment:  Physiologic.

Vertebrae: No acute fracture, evidence of discitis, or aggressive
bone lesion. Tiny Schmorl's node involving the T12-L1, L2, L3 and L4
vertebral bodies.

Conus medullaris and cauda equina: Conus extends to the L1 level.
Conus and cauda equina appear normal.

Paraspinal and other soft tissues: No acute paraspinal abnormality.

Disc levels:

Disc spaces: Disc spaces are maintained.

T12-L1: No significant disc bulge. No neural foraminal stenosis. No
central canal stenosis.

L1-L2: No significant disc bulge. No neural foraminal stenosis. No
central canal stenosis.

L2-L3: No significant disc bulge. No neural foraminal stenosis. No
central canal stenosis.

L3-L4: No significant disc bulge. No neural foraminal stenosis. No
central canal stenosis.

L4-L5: No significant disc bulge. No neural foraminal stenosis. No
central canal stenosis.

L5-S1: Small right paracentral disc protrusion with mass effect on
the right intraspinal S1 nerve root. No foraminal or central canal
stenosis.
IMPRESSION: 1. At L5-S1 there is a small right paracentral disc protrusion with
mass effect on the right intraspinal S1 nerve root.

## 2023-05-31 ENCOUNTER — Ambulatory Visit (INDEPENDENT_AMBULATORY_CARE_PROVIDER_SITE_OTHER): Payer: No Typology Code available for payment source | Admitting: Physical Medicine and Rehabilitation

## 2023-05-31 ENCOUNTER — Other Ambulatory Visit: Payer: Self-pay

## 2023-05-31 VITALS — BP 155/92 | HR 77

## 2023-05-31 DIAGNOSIS — M5416 Radiculopathy, lumbar region: Secondary | ICD-10-CM | POA: Diagnosis not present

## 2023-05-31 MED ORDER — METHYLPREDNISOLONE ACETATE 80 MG/ML IJ SUSP
80.0000 mg | Freq: Once | INTRAMUSCULAR | Status: AC
  Administered 2023-05-31: 80 mg

## 2023-05-31 NOTE — Progress Notes (Signed)
Functional Pain Scale - descriptive words and definitions  Distracting (5)    Aware of pain/able to complete some ADL's but limited by pain/sleep is affected and active distractions are only slightly useful. Moderate range order  Average Pain  varies on activities   +Driver, -BT, -Dye Allergies.  Lower back pain on right side that radiates into the right leg. Pain worsens with certain activities

## 2023-05-31 NOTE — Procedures (Signed)
Lumbosacral Transforaminal Epidural Steroid Injection - Sub-Pedicular Approach with Fluoroscopic Guidance  Patient: Samuel Vasquez      Date of Birth: 03-08-1981 MRN: [id number] PCP: Carlean Jews, NP      Visit Date: 05/31/2023   Universal Protocol:    Date/Time: 05/31/2023  Consent Given By: the patient  Position: PRONE  Additional Comments: Vital signs were monitored before and after the procedure. Patient was prepped and draped in the usual sterile fashion. The correct patient, procedure, and site was verified.   Injection Procedure Details:   Procedure diagnoses: Lumbar radiculopathy [M54.16]    Meds Administered:  Meds ordered this encounter  Medications   methylPREDNISolone acetate (DEPO-MEDROL) injection 80 mg    Laterality: Right  Location/Site: L5  Needle:5.0 in., 22 ga.  Short bevel or Quincke spinal needle  Needle Placement: Transforaminal  Findings:    -Comments: Excellent flow of contrast along the nerve, nerve root and into the epidural space.  Procedure Details: After squaring off the end-plates to get a true AP view, the C-arm was positioned so that an oblique view of the foramen as noted above was visualized. The target area is just inferior to the "nose of the scotty dog" or sub pedicular. The soft tissues overlying this structure were infiltrated with 2-3 ml. of 1% Lidocaine without Epinephrine.  The spinal needle was inserted toward the target using a "trajectory" view along the fluoroscope beam.  Under AP and lateral visualization, the needle was advanced so it did not puncture dura and was located close the 6 O'Clock position of the pedical in AP tracterory. Biplanar projections were used to confirm position. Aspiration was confirmed to be negative for CSF and/or blood. A 1-2 ml. volume of Isovue-250 was injected and flow of contrast was noted at each level. Radiographs were obtained for documentation purposes.   After attaining the desired flow  of contrast documented above, a 0.5 to 1.0 ml test dose of 0.25% Marcaine was injected into each respective transforaminal space.  The patient was observed for 90 seconds post injection.  After no sensory deficits were reported, and normal lower extremity motor function was noted,   the above injectate was administered so that equal amounts of the injectate were placed at each foramen (level) into the transforaminal epidural space.   Additional Comments:  No complications occurred Dressing: 2 x 2 sterile gauze and Band-Aid    Post-procedure details: Patient was observed during the procedure. Post-procedure instructions were reviewed.  Patient left the clinic in stable condition.

## 2023-05-31 NOTE — Progress Notes (Signed)
Samuel Vasquez - 42 y.o. male MRN [id number]  Date of birth: 1981-07-05  Office Visit Note: Visit Date: 05/31/2023 PCP: Carlean Jews, NP Referred by: London Sheer, MD  Subjective: Chief Complaint  Patient presents with   Lower Back - Pain   HPI:  [name] is a 42 y.o. male who comes in today at the request of Dr. Willia Craze for planned Right L5-S1 Lumbar Transforaminal epidural steroid injection with fluoroscopic guidance.  The patient has failed conservative care including home exercise, medications, time and activity modification.  This injection will be diagnostic and hopefully therapeutic.  Please see requesting physician notes for further details and justification.   ROS Otherwise per HPI.  Assessment & Plan: Visit Diagnoses:    ICD-10-CM   1. Lumbar radiculopathy  M54.16 XR C-ARM NO REPORT    Epidural Steroid injection    methylPREDNISolone acetate (DEPO-MEDROL) injection 80 mg      Plan: No additional findings.   Meds & Orders:  Meds ordered this encounter  Medications   methylPREDNISolone acetate (DEPO-MEDROL) injection 80 mg    Orders Placed This Encounter  Procedures   XR C-ARM NO REPORT   Epidural Steroid injection    Follow-up: Return for visit to requesting provider as needed.   Procedures: No procedures performed  Lumbosacral Transforaminal Epidural Steroid Injection - Sub-Pedicular Approach with Fluoroscopic Guidance  Patient: Samuel Vasquez      Date of Birth: 24-Oct-1981 MRN: [id number] PCP: Carlean Jews, NP      Visit Date: 05/31/2023   Universal Protocol:    Date/Time: 05/31/2023  Consent Given By: the patient  Position: PRONE  Additional Comments: Vital signs were monitored before and after the procedure. Patient was prepped and draped in the usual sterile fashion. The correct patient, procedure, and site was verified.   Injection Procedure Details:   Procedure diagnoses: Lumbar radiculopathy [M54.16]    Meds  Administered:  Meds ordered this encounter  Medications   methylPREDNISolone acetate (DEPO-MEDROL) injection 80 mg    Laterality: Right  Location/Site: L5  Needle:5.0 in., 22 ga.  Short bevel or Quincke spinal needle  Needle Placement: Transforaminal  Findings:    -Comments: Excellent flow of contrast along the nerve, nerve root and into the epidural space.  Procedure Details: After squaring off the end-plates to get a true AP view, the C-arm was positioned so that an oblique view of the foramen as noted above was visualized. The target area is just inferior to the "nose of the scotty dog" or sub pedicular. The soft tissues overlying this structure were infiltrated with 2-3 ml. of 1% Lidocaine without Epinephrine.  The spinal needle was inserted toward the target using a "trajectory" view along the fluoroscope beam.  Under AP and lateral visualization, the needle was advanced so it did not puncture dura and was located close the 6 O'Clock position of the pedical in AP tracterory. Biplanar projections were used to confirm position. Aspiration was confirmed to be negative for CSF and/or blood. A 1-2 ml. volume of Isovue-250 was injected and flow of contrast was noted at each level. Radiographs were obtained for documentation purposes.   After attaining the desired flow of contrast documented above, a 0.5 to 1.0 ml test dose of 0.25% Marcaine was injected into each respective transforaminal space.  The patient was observed for 90 seconds post injection.  After no sensory deficits were reported, and normal lower extremity motor function was noted,   the above injectate was administered so  that equal amounts of the injectate were placed at each foramen (level) into the transforaminal epidural space.   Additional Comments:  No complications occurred Dressing: 2 x 2 sterile gauze and Band-Aid    Post-procedure details: Patient was observed during the procedure. Post-procedure instructions  were reviewed.  Patient left the clinic in stable condition.    Clinical History: MRI LUMBAR SPINE WITHOUT CONTRAST   TECHNIQUE: Multiplanar, multisequence MR imaging of the lumbar spine was performed. No intravenous contrast was administered.   COMPARISON:  X-ray lumbar 10/03/2021.   FINDINGS: Segmentation:  Standard.   Alignment:  Physiologic.   Vertebrae: No acute fracture, evidence of discitis, or aggressive bone lesion. Tiny Schmorl's node involving the T12-L1, L2, L3 and L4 vertebral bodies.   Conus medullaris and cauda equina: Conus extends to the L1 level. Conus and cauda equina appear normal.   Paraspinal and other soft tissues: No acute paraspinal abnormality.   Disc levels:   Disc spaces: Disc spaces are maintained.   T12-L1: No significant disc bulge. No neural foraminal stenosis. No central canal stenosis.   L1-L2: No significant disc bulge. No neural foraminal stenosis. No central canal stenosis.   L2-L3: No significant disc bulge. No neural foraminal stenosis. No central canal stenosis.   L3-L4: No significant disc bulge. No neural foraminal stenosis. No central canal stenosis.   L4-L5: No significant disc bulge. No neural foraminal stenosis. No central canal stenosis.   L5-S1: Small right paracentral disc protrusion with mass effect on the right intraspinal S1 nerve root. No foraminal or central canal stenosis.   IMPRESSION: 1. At L5-S1 there is a small right paracentral disc protrusion with mass effect on the right intraspinal S1 nerve root.     Electronically Signed   By: Elige Ko M.D.   On: 10/28/2021 09:54     Objective:  VS:  HT:    WT:   BMI:     BP:(!) 155/92  HR:77bpm  TEMP: ( )  RESP:  Physical Exam Vitals and nursing note reviewed.  Constitutional:      General: He is not in acute distress.    Appearance: Normal appearance. He is not ill-appearing.  HENT:     Head: Normocephalic and atraumatic.     Right Ear:  External ear normal.     Left Ear: External ear normal.     Nose: No congestion.  Eyes:     Extraocular Movements: Extraocular movements intact.  Cardiovascular:     Rate and Rhythm: Normal rate.     Pulses: Normal pulses.  Pulmonary:     Effort: Pulmonary effort is normal. No respiratory distress.  Abdominal:     General: There is no distension.     Palpations: Abdomen is soft.  Musculoskeletal:        General: No tenderness or signs of injury.     Cervical back: Neck supple.     Right lower leg: No edema.     Left lower leg: No edema.     Comments: Patient has good distal strength without clonus.  Skin:    Findings: No erythema or rash.  Neurological:     General: No focal deficit present.     Mental Status: He is alert and oriented to person, place, and time.     Sensory: No sensory deficit.     Motor: No weakness or abnormal muscle tone.     Coordination: Coordination normal.  Psychiatric:        Mood and Affect:  Mood normal.        Behavior: Behavior normal.      Imaging: No results found.

## 2023-05-31 NOTE — Patient Instructions (Signed)

## 2023-06-25 ENCOUNTER — Ambulatory Visit (INDEPENDENT_AMBULATORY_CARE_PROVIDER_SITE_OTHER): Payer: No Typology Code available for payment source | Admitting: Orthopedic Surgery

## 2023-06-25 DIAGNOSIS — M5126 Other intervertebral disc displacement, lumbar region: Secondary | ICD-10-CM

## 2023-06-25 NOTE — Progress Notes (Signed)
Orthopedic Spine Surgery Office Note   Assessment: Patient is a 42 y.o. male with low back pain that radiates into the right lower extremity.  Feels it along the lateral aspect of the thigh and the leg.     Plan: -Patient has tried gabapentin, Tylenol, muscle relaxer, lumbar brace, lumbar steroid injection -Discussed L5/S1 microdiscectomy as a treatment option for him.  He was not interested in that at this time -I had previously referred him to PT, but he has been unable to do that.  He wanted to try that option so I told him to call the PT office in [facility] to set those appointments up since he already has a referral -Patient should return to office on an as-needed basis     Patient expressed understanding of the plan and all questions were answered to the patient's satisfaction.    ___________________________________________________________________________   History: Patient is a 42 y.o. male who has been previously seen in the office for symptoms consistent with lumbar radiculopathy.  Since our last visit, he got a lumbar steroid injection.  He said he noticed good relief of his lower back pain with that and some of the radiating leg pain.  He still has pain that radiates down the lateral aspect of his right thigh and leg.  Denies paresthesias and numbness.  No bowel or bladder incontinence.  No saddle anesthesia.   Previous treatments: gabapentin, Tylenol, muscle relaxer, lumbar brace, lumbar steroid injection     Physical Exam:   General: no acute distress, appears stated age Neurologic: alert, answering questions appropriately, following commands Respiratory: unlabored breathing on room air, symmetric chest rise Psychiatric: appropriate affect, normal cadence to speech     MSK (spine):   -Strength exam                                                   Left                  Right EHL                              5/5                  4/5 TA                                  5/5                  4/5 GSC                             5/5                  5/5 Knee extension            5/5                  5/5 Hip flexion                    5/5                  5/5     -Sensory exam  Sensation intact to light touch in L3-S1 nerve distributions of bilateral lower extremities   -Achilles DTR: 2/4 on the left, 2/4 on the right -Patellar tendon DTR: 2/4 on the left, 2/4 on the right   -Straight leg raise: negative bilaterally -Femoral nerve stretch test: negative bilaterally -Clonus: no beats bilaterally   Imaging: XR of the lumbar spine from 02/15/2023 was previously independently reviewed and interpreted, showing disc height loss at L5/S1.  No fracture or dislocation.  No evidence of instability on flexion/extension views.    MRI of the lumbar spine from 04/21/2023 was previously independently reviewed and interpreted, showing right paracentral disc herniation at L5/S1. No other significant stenosis seen.      Patient name: Samuel Vasquez Patient MRN: [id number] Date of visit: 06/25/23
